# Patient Record
Sex: Female | Born: 1940
Health system: Southern US, Community
[De-identification: ages and names within clinical notes are randomized; demographics above are authoritative.]

## PROBLEM LIST (undated history)

## (undated) DIAGNOSIS — M1990 Unspecified osteoarthritis, unspecified site: Secondary | ICD-10-CM

## (undated) DIAGNOSIS — G971 Other reaction to spinal and lumbar puncture: Secondary | ICD-10-CM

## (undated) DIAGNOSIS — K219 Gastro-esophageal reflux disease without esophagitis: Secondary | ICD-10-CM

## (undated) DIAGNOSIS — I10 Essential (primary) hypertension: Secondary | ICD-10-CM

## (undated) DIAGNOSIS — Z8719 Personal history of other diseases of the digestive system: Secondary | ICD-10-CM

## (undated) HISTORY — PX: TUBAL LIGATION: SHX77

## (undated) HISTORY — PX: ABDOMINAL HYSTERECTOMY: SHX81

---
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
## 2011-05-27 ENCOUNTER — Ambulatory Visit: Payer: Self-pay | Admitting: Family Medicine

## 2012-02-11 ENCOUNTER — Ambulatory Visit: Payer: Self-pay | Admitting: Internal Medicine

## 2012-06-06 ENCOUNTER — Ambulatory Visit: Payer: Self-pay | Admitting: Family Medicine

## 2012-12-13 ENCOUNTER — Ambulatory Visit: Payer: Self-pay | Admitting: Family Medicine

## 2013-06-14 ENCOUNTER — Ambulatory Visit: Payer: Self-pay | Admitting: Family Medicine

## 2014-04-18 DIAGNOSIS — M5135 Other intervertebral disc degeneration, thoracolumbar region: Secondary | ICD-10-CM | POA: Insufficient documentation

## 2014-06-25 ENCOUNTER — Ambulatory Visit: Payer: Self-pay | Admitting: Internal Medicine

## 2015-05-22 ENCOUNTER — Other Ambulatory Visit: Payer: Self-pay | Admitting: Internal Medicine

## 2015-05-22 DIAGNOSIS — Z1231 Encounter for screening mammogram for malignant neoplasm of breast: Secondary | ICD-10-CM

## 2015-07-01 ENCOUNTER — Ambulatory Visit
Admission: RE | Admit: 2015-07-01 | Discharge: 2015-07-01 | Disposition: A | Discharge status: Home / Self Care | Payer: PPO | Source: Ambulatory Visit | Attending: Internal Medicine | Admitting: Internal Medicine

## 2015-07-01 DIAGNOSIS — Z1231 Encounter for screening mammogram for malignant neoplasm of breast: Secondary | ICD-10-CM | POA: Diagnosis not present

## 2016-05-19 ENCOUNTER — Other Ambulatory Visit: Payer: Self-pay | Admitting: Internal Medicine

## 2016-05-19 DIAGNOSIS — Z1231 Encounter for screening mammogram for malignant neoplasm of breast: Secondary | ICD-10-CM

## 2016-05-21 DIAGNOSIS — I10 Essential (primary) hypertension: Secondary | ICD-10-CM | POA: Diagnosis not present

## 2016-05-21 DIAGNOSIS — M1990 Unspecified osteoarthritis, unspecified site: Secondary | ICD-10-CM | POA: Diagnosis not present

## 2016-05-21 DIAGNOSIS — M25562 Pain in left knee: Secondary | ICD-10-CM | POA: Diagnosis not present

## 2016-05-21 DIAGNOSIS — M25561 Pain in right knee: Secondary | ICD-10-CM | POA: Diagnosis not present

## 2016-05-21 DIAGNOSIS — F411 Generalized anxiety disorder: Secondary | ICD-10-CM | POA: Diagnosis not present

## 2016-05-21 DIAGNOSIS — G8929 Other chronic pain: Secondary | ICD-10-CM | POA: Diagnosis not present

## 2016-05-21 DIAGNOSIS — Z1231 Encounter for screening mammogram for malignant neoplasm of breast: Secondary | ICD-10-CM | POA: Diagnosis not present

## 2016-05-21 DIAGNOSIS — E7800 Pure hypercholesterolemia, unspecified: Secondary | ICD-10-CM | POA: Diagnosis not present

## 2016-06-02 DIAGNOSIS — R0609 Other forms of dyspnea: Secondary | ICD-10-CM | POA: Diagnosis not present

## 2016-06-02 DIAGNOSIS — M5137 Other intervertebral disc degeneration, lumbosacral region: Secondary | ICD-10-CM | POA: Diagnosis not present

## 2016-06-02 DIAGNOSIS — E6609 Other obesity due to excess calories: Secondary | ICD-10-CM | POA: Diagnosis not present

## 2016-06-10 DIAGNOSIS — M170 Bilateral primary osteoarthritis of knee: Secondary | ICD-10-CM | POA: Diagnosis not present

## 2016-06-10 DIAGNOSIS — M25561 Pain in right knee: Secondary | ICD-10-CM | POA: Diagnosis not present

## 2016-06-10 DIAGNOSIS — M25562 Pain in left knee: Secondary | ICD-10-CM | POA: Diagnosis not present

## 2016-06-10 DIAGNOSIS — S83011A Lateral subluxation of right patella, initial encounter: Secondary | ICD-10-CM | POA: Diagnosis not present

## 2016-07-12 ENCOUNTER — Ambulatory Visit
Admission: RE | Admit: 2016-07-12 | Discharge: 2016-07-12 | Disposition: A | Discharge status: Home / Self Care | Payer: PPO | Source: Ambulatory Visit | Attending: Internal Medicine | Admitting: Internal Medicine

## 2016-07-12 DIAGNOSIS — Z1231 Encounter for screening mammogram for malignant neoplasm of breast: Secondary | ICD-10-CM | POA: Insufficient documentation

## 2016-07-27 DIAGNOSIS — Z79899 Other long term (current) drug therapy: Secondary | ICD-10-CM | POA: Diagnosis not present

## 2016-07-27 DIAGNOSIS — E7800 Pure hypercholesterolemia, unspecified: Secondary | ICD-10-CM | POA: Diagnosis not present

## 2016-07-27 DIAGNOSIS — I10 Essential (primary) hypertension: Secondary | ICD-10-CM | POA: Diagnosis not present

## 2016-07-27 DIAGNOSIS — N390 Urinary tract infection, site not specified: Secondary | ICD-10-CM | POA: Diagnosis not present

## 2016-08-03 DIAGNOSIS — Z0000 Encounter for general adult medical examination without abnormal findings: Secondary | ICD-10-CM | POA: Diagnosis not present

## 2016-08-03 DIAGNOSIS — E7800 Pure hypercholesterolemia, unspecified: Secondary | ICD-10-CM | POA: Diagnosis not present

## 2016-08-03 DIAGNOSIS — Z79899 Other long term (current) drug therapy: Secondary | ICD-10-CM | POA: Diagnosis not present

## 2016-08-03 DIAGNOSIS — I10 Essential (primary) hypertension: Secondary | ICD-10-CM | POA: Diagnosis not present

## 2016-08-03 DIAGNOSIS — M5135 Other intervertebral disc degeneration, thoracolumbar region: Secondary | ICD-10-CM | POA: Diagnosis not present

## 2016-09-28 ENCOUNTER — Ambulatory Visit: Payer: PPO | Admitting: Urology

## 2016-09-28 ENCOUNTER — Encounter: Payer: Self-pay | Admitting: Urology

## 2016-09-28 VITALS — BP 126/79 | HR 67 | Ht 63.0 in | Wt 166.7 lb

## 2016-09-28 DIAGNOSIS — R3129 Other microscopic hematuria: Secondary | ICD-10-CM

## 2016-09-28 DIAGNOSIS — M1990 Unspecified osteoarthritis, unspecified site: Secondary | ICD-10-CM | POA: Insufficient documentation

## 2016-09-28 DIAGNOSIS — R3913 Splitting of urinary stream: Secondary | ICD-10-CM

## 2016-09-28 DIAGNOSIS — Z87448 Personal history of other diseases of urinary system: Secondary | ICD-10-CM | POA: Insufficient documentation

## 2016-09-28 DIAGNOSIS — K219 Gastro-esophageal reflux disease without esophagitis: Secondary | ICD-10-CM | POA: Insufficient documentation

## 2016-09-28 DIAGNOSIS — K589 Irritable bowel syndrome without diarrhea: Secondary | ICD-10-CM | POA: Insufficient documentation

## 2016-09-28 DIAGNOSIS — E785 Hyperlipidemia, unspecified: Secondary | ICD-10-CM | POA: Insufficient documentation

## 2016-09-28 DIAGNOSIS — L409 Psoriasis, unspecified: Secondary | ICD-10-CM | POA: Insufficient documentation

## 2016-09-28 DIAGNOSIS — I10 Essential (primary) hypertension: Secondary | ICD-10-CM | POA: Insufficient documentation

## 2016-09-28 NOTE — Progress Notes (Signed)
09/28/2016 3:06 PM   Holly Robbins 02-May-1941 [id number]  Referring provider: Marguarite ArbourJeffrey D Sparks, MD [address] [facility] [place], [place] [id number]  Chief Complaint  Patient presents with  . New Patient (Initial Visit)    microscopic hematuria     HPI: Consultation for microscopic hematuria. Patient had a urinalysis done January 2018, October 2016, May 2016, August 2015 all with 4-10 red blood cells per high powered field. There were many bacteria. No gross hematuria. She quit smoking in 1990. No other exposures.   Hx in 1973.   She voids with an intermittent stream, sometimes not empty.  No frequency except in AM after coffee. She has some urgency. Noc x 2.   She developed hives on her face after a CT with contrast in the past.   PMH: No past medical history on file.  Surgical History: Past Surgical History:  Procedure Laterality Date  . ABDOMINAL HYSTERECTOMY      Home Medications:  Allergies as of 09/28/2016      Reactions   Atorvastatin    Other reaction(s): Muscle Pain   Erythromycin Hives   Other Hives   Xray dye eggs   Penicillins Hives      Medication List       Accurate as of 09/28/16  3:06 PM. Always use your most recent med list.          aspirin EC 81 MG tablet Take by mouth.   CALCIUM 600 600 MG Tabs tablet Generic drug:  calcium carbonate Take by mouth.   diclofenac 75 MG EC tablet Commonly known as:  VOLTAREN TAKE ONE TABLET BY MOUTH TWICE DAILY WITH MEALS   dicyclomine 20 MG tablet Commonly known as:  BENTYL Take by mouth.   lisinopril-hydrochlorothiazide 20-12.5 MG tablet Commonly known as:  PRINZIDE,ZESTORETIC TAKE ONE TABLET BY MOUTH ONCE DAILY   METAMUCIL 0.52 g capsule Generic drug:  psyllium Take by mouth.   ranitidine 300 MG capsule Commonly known as:  ZANTAC Take by mouth.   Vitamin B 12 100 MCG Lozg Take by mouth.   vitamin E 400 UNIT capsule Take by mouth.       Allergies:    Allergies  Allergen Reactions  . Atorvastatin     Other reaction(s): Muscle Pain  . Erythromycin Hives  . Other Hives    Xray dye eggs  . Penicillins Hives    Family History: Family History  Problem Relation Age of Onset  . Chronic Renal Failure Father   . Prostate cancer Father   . Hematuria Sister   . Chronic Renal Failure Son   . Breast cancer Neg Hx     Social History:  reports that she quit smoking about 26 years ago. She has never used smokeless tobacco. She reports that she does not drink alcohol or use drugs.  ROS: UROLOGY Frequent Urination?: No Hard to postpone urination?: Yes Burning/pain with urination?: No Get up at night to urinate?: Yes Leakage of urine?: No Urine stream starts and stops?: No Trouble starting stream?: No Do you have to strain to urinate?: No Blood in urine?: Yes Urinary tract infection?: No Sexually transmitted disease?: No Injury to kidneys or bladder?: No Painful intercourse?: No Weak stream?: No Currently pregnant?: No Vaginal bleeding?: No Last menstrual period?: n  Gastrointestinal Nausea?: No Vomiting?: No Indigestion/heartburn?: Yes Diarrhea?: No Constipation?: No  Constitutional Fever: Yes Night sweats?: No Weight loss?: No Fatigue?: Yes  Skin Skin rash/lesions?: No Itching?: No  Eyes  Blurred vision?: No Double vision?: No  Ears/Nose/Throat Sore throat?: No Sinus problems?: No  Hematologic/Lymphatic Swollen glands?: No Easy bruising?: No  Cardiovascular Leg swelling?: No Chest pain?: No  Respiratory Cough?: No Shortness of breath?: No  Endocrine Excessive thirst?: No  Musculoskeletal Back pain?: Yes Joint pain?: Yes  Neurological Headaches?: No Dizziness?: No  Psychologic Depression?: No Anxiety?: No  Physical Exam: BP 126/79   Pulse 67   Ht 5\' 3"  (1.6 m)   Wt 75.6 kg (166 lb 11.2 oz)   BMI 29.53 kg/m   Constitutional:  Alert and oriented, No acute distress. HEENT:  AT,  moist mucus membranes.  Trachea midline, no masses. Cardiovascular: No clubbing, cyanosis, or edema. Respiratory: Normal respiratory effort, no increased work of breathing. GI: Abdomen is soft, nontender, nondistended, no abdominal masses GU: No CVA tenderness. Skin: No rashes, bruises or suspicious lesions. Lymph: No cervical or inguinal adenopathy. Neurologic: Grossly intact, no focal deficits, moving all 4 extremities. Psychiatric: Normal mood and affect.  Laboratory Data: No results found for: WBC, HGB, HCT, MCV, PLT  No results found for: CREATININE  No results found for: PSA  No results found for: TESTOSTERONE  No results found for: HGBA1C  Urinalysis No results found for: COLORURINE, APPEARANCEUR, LABSPEC, PHURINE, GLUCOSEU, HGBUR, BILIRUBINUR, KETONESUR, PROTEINUR, UROBILINOGEN, NITRITE, LEUKOCYTESUR   Assessment & Plan:    1. Microhematuria - discussed with patient nature r/b of CT and cystoscopy. She elects to proceed. Given her prior contrast rxn, will scan with non-con first. Urine for Cx.   2. Intermittent stream - check PVR    - Urinalysis, Complete   No Follow-up on file.  Jerilee Field, MD  [facility] [address] [place], [place] [phone]

## 2016-09-29 LAB — URINALYSIS, COMPLETE
BILIRUBIN UA: NEGATIVE
Glucose, UA: NEGATIVE
KETONES UA: NEGATIVE
LEUKOCYTES UA: NEGATIVE
Nitrite, UA: NEGATIVE
PH UA: 5 (ref 5.0–7.5)
PROTEIN UA: NEGATIVE
Specific Gravity, UA: <1.005 — ABNORMAL LOW (ref 1.005–1.030)
UUROB: 0.2 mg/dL (ref 0.2–1.0)

## 2016-09-29 LAB — MICROSCOPIC EXAMINATION: WBC UA: NONE SEEN /HPF (ref 0–?)

## 2016-10-02 LAB — CULTURE, URINE COMPREHENSIVE

## 2016-10-08 ENCOUNTER — Ambulatory Visit
Admission: RE | Admit: 2016-10-08 | Discharge: 2016-10-08 | Disposition: A | Discharge status: Home / Self Care | Payer: PPO | Source: Ambulatory Visit | Attending: Urology | Admitting: Urology

## 2016-10-08 DIAGNOSIS — R3129 Other microscopic hematuria: Secondary | ICD-10-CM | POA: Insufficient documentation

## 2016-10-08 DIAGNOSIS — I700 Atherosclerosis of aorta: Secondary | ICD-10-CM | POA: Diagnosis not present

## 2016-10-08 DIAGNOSIS — N289 Disorder of kidney and ureter, unspecified: Secondary | ICD-10-CM | POA: Diagnosis not present

## 2016-10-13 ENCOUNTER — Telehealth: Payer: Self-pay

## 2016-10-13 ENCOUNTER — Ambulatory Visit: Payer: PPO | Admitting: Urology

## 2016-10-13 VITALS — BP 117/68 | HR 71 | Ht 63.0 in | Wt 167.7 lb

## 2016-10-13 DIAGNOSIS — R3129 Other microscopic hematuria: Secondary | ICD-10-CM

## 2016-10-13 LAB — URINALYSIS, COMPLETE
Bilirubin, UA: NEGATIVE
GLUCOSE, UA: NEGATIVE
Ketones, UA: NEGATIVE
Leukocytes, UA: NEGATIVE
Nitrite, UA: NEGATIVE
PH UA: 5.5 (ref 5.0–7.5)
Protein, UA: NEGATIVE
Specific Gravity, UA: 1.01 (ref 1.005–1.030)
UUROB: 0.2 mg/dL (ref 0.2–1.0)

## 2016-10-13 LAB — MICROSCOPIC EXAMINATION: Bacteria, UA: NONE SEEN

## 2016-10-13 MED ORDER — LIDOCAINE HCL 2 % EX GEL
1.0000 "application " | Freq: Once | CUTANEOUS | Status: AC
  Administered 2016-10-13: 1 via URETHRAL

## 2016-10-13 MED ORDER — CIPROFLOXACIN HCL 500 MG PO TABS
500.0000 mg | ORAL_TABLET | Freq: Once | ORAL | Status: AC
  Administered 2016-10-13: 500 mg via ORAL

## 2016-10-13 NOTE — Telephone Encounter (Signed)
Jerilee FieldMatthew Eskridge, MD  Skeet Latchhelsea C Watkins, LPN        Notify patient her CT scan looked OK - no worrisome findings. F/u as planned for exam and cystoscopy.    LMOM- CT ok, keep f/u as planned for cysto and exam

## 2016-10-13 NOTE — Progress Notes (Signed)
   10/13/16  CC:  Chief Complaint  Patient presents with  . Cysto    microscopic hemturia    HPI: The patient is a 76 year old female who presents today for completion of her microscopic hematuria workup. Patient had a urinalysis done January 2018, October 2016, May 2016, August 2015 all with 4-10 red blood cells per high powered field. There were many bacteria. No gross hematuria. She quit smoking in 1990. No other exposures. Most recent urine culture at initial visit was unremarkable for pathogens.  The patient has a history of hives from IV contrast, so she only underwent a CT stone protocol which was unremarkable for source of hematuria. There was no hydronephrosis, stones, or suspicious areas.  There was a small amount of air in the bladder though she had a bladder infection not long before the study was performed. There is no bowel thickening or inflammation around the urinary bladder to suggest a possible fistula.  NED. A&Ox3.   No respiratory distress   Abd soft, NT, ND Normal external genitalia with patent urethral meatus  Cystoscopy Procedure Note  Patient identification was confirmed, informed consent was obtained, and patient was prepped using Betadine solution.  Lidocaine jelly was administered per urethral meatus.    Preoperative abx where received prior to procedure.    Procedure: - Flexible cystoscope introduced, without any difficulty.   - Thorough search of the bladder revealed:    normal urethral meatus    normal urothelium    no stones    no ulcers     no tumors    no urethral polyps    no trabeculation  - Ureteral orifices were normal in position and appearance.  Post-Procedure: - Patient tolerated the procedure well  Assessment/ Plan:  1. Microscopic hematuria -Negative work up.  Since she has had recurrent microhematuria for at least 4 years and this work up was negative, I do not think she needs further follow up at this time unless she were to  develop gross hematuria.  2. History of UTI -patient will contact office if she feels like she is developing a UTI.   Hildred LaserBrian James Symphonie Schneiderman, MD

## 2016-11-03 DIAGNOSIS — I10 Essential (primary) hypertension: Secondary | ICD-10-CM | POA: Diagnosis not present

## 2016-11-03 DIAGNOSIS — Z79899 Other long term (current) drug therapy: Secondary | ICD-10-CM | POA: Diagnosis not present

## 2016-11-03 DIAGNOSIS — E7800 Pure hypercholesterolemia, unspecified: Secondary | ICD-10-CM | POA: Diagnosis not present

## 2016-11-10 DIAGNOSIS — E7800 Pure hypercholesterolemia, unspecified: Secondary | ICD-10-CM | POA: Diagnosis not present

## 2016-11-10 DIAGNOSIS — I10 Essential (primary) hypertension: Secondary | ICD-10-CM | POA: Diagnosis not present

## 2016-11-25 DIAGNOSIS — M1711 Unilateral primary osteoarthritis, right knee: Secondary | ICD-10-CM | POA: Diagnosis not present

## 2016-11-26 ENCOUNTER — Other Ambulatory Visit: Payer: Self-pay | Admitting: Orthopedic Surgery

## 2016-11-26 DIAGNOSIS — M1711 Unilateral primary osteoarthritis, right knee: Secondary | ICD-10-CM

## 2016-12-14 ENCOUNTER — Ambulatory Visit
Admission: RE | Admit: 2016-12-14 | Discharge: 2016-12-14 | Disposition: A | Discharge status: Home / Self Care | Payer: PPO | Source: Ambulatory Visit | Attending: Orthopedic Surgery | Admitting: Orthopedic Surgery

## 2016-12-14 DIAGNOSIS — M1711 Unilateral primary osteoarthritis, right knee: Secondary | ICD-10-CM | POA: Insufficient documentation

## 2016-12-14 DIAGNOSIS — M1611 Unilateral primary osteoarthritis, right hip: Secondary | ICD-10-CM | POA: Diagnosis not present

## 2017-01-20 DIAGNOSIS — R1084 Generalized abdominal pain: Secondary | ICD-10-CM | POA: Diagnosis not present

## 2017-01-28 DIAGNOSIS — M1711 Unilateral primary osteoarthritis, right knee: Secondary | ICD-10-CM | POA: Diagnosis not present

## 2017-02-02 ENCOUNTER — Encounter
Admission: RE | Admit: 2017-02-02 | Discharge: 2017-02-02 | Disposition: A | Discharge status: Home / Self Care | Payer: PPO | Source: Ambulatory Visit | Attending: Orthopedic Surgery | Admitting: Orthopedic Surgery

## 2017-02-02 DIAGNOSIS — Z01818 Encounter for other preprocedural examination: Secondary | ICD-10-CM | POA: Diagnosis not present

## 2017-02-02 DIAGNOSIS — I10 Essential (primary) hypertension: Secondary | ICD-10-CM | POA: Diagnosis not present

## 2017-02-02 HISTORY — DX: Unspecified osteoarthritis, unspecified site: M19.90

## 2017-02-02 HISTORY — DX: Essential (primary) hypertension: I10

## 2017-02-02 HISTORY — DX: Personal history of other diseases of the digestive system: Z87.19

## 2017-02-02 HISTORY — DX: Gastro-esophageal reflux disease without esophagitis: K21.9

## 2017-02-02 HISTORY — DX: Other reaction to spinal and lumbar puncture: G97.1

## 2017-02-02 LAB — TYPE AND SCREEN
ABO/RH(D): O POS
Antibody Screen: NEGATIVE

## 2017-02-02 LAB — URINALYSIS, ROUTINE W REFLEX MICROSCOPIC
BACTERIA UA: NONE SEEN
Bilirubin Urine: NEGATIVE
Glucose, UA: NEGATIVE mg/dL
KETONES UR: NEGATIVE mg/dL
Leukocytes, UA: NEGATIVE
Nitrite: NEGATIVE
PROTEIN: NEGATIVE mg/dL
Specific Gravity, Urine: 1.008 (ref 1.005–1.030)
WBC UA: NONE SEEN WBC/hpf (ref 0–5)
pH: 6 (ref 5.0–8.0)

## 2017-02-02 LAB — SURGICAL PCR SCREEN
MRSA, PCR: NEGATIVE
STAPHYLOCOCCUS AUREUS: NEGATIVE

## 2017-02-02 LAB — SEDIMENTATION RATE: Sed Rate: 15 mm/hr (ref 0–30)

## 2017-02-02 LAB — PROTIME-INR
INR: 1.06
PROTHROMBIN TIME: 13.8 s (ref 11.4–15.2)

## 2017-02-02 LAB — APTT: aPTT: 32 seconds (ref 24–36)

## 2017-02-02 NOTE — Pre-Procedure Instructions (Signed)
Progress Notes - in this encounter  Table of Contents for Progress Notes Marlena Clipper, MD - 01/28/2017 1:45 PM EDT Margaretmary Eddy, CMA - 01/28/2017 1:45 PM EDT   Marlena Clipper, MD - 01/28/2017 1:45 PM EDT Formatting of this note may be different from the original. Chief Complaint  Patient presents with  . Knee Pain  H&P R TKA scheduled 02/08/17.   History of Present Illness: Holly Robbins is a 76 y.o. female who comes to clinic today for history and physical for upcoming right total knee replacement scheduled for 02/08/2017. The patient has prior x-rays that show severe osteoarthritis of the right knee with significant bone loss of the patella. She has had pain for years. The patient has tried corticosteroid injections without significant relief. She has been in an exercise program. She has also worn a brace without relief. The patient is currently taking diclofenac without significant pain relief. She is also on Zantac. She is unable to walk for any significant distance due to pain. The patient had her MyKnee CT completed and comes in today to discuss surgery.   She is not on blood thinners. She does take a daily aspirin.  Past Medical History: Past Medical History:  Diagnosis Date  . Arthritis  . GERD (gastroesophageal reflux disease)  . History of hematuria  . Hyperlipidemia, unspecified  . Hypertension  . IBS (irritable bowel syndrome)  . Psoriasis, unspecified   Past Surgical History: Past Surgical History:  Procedure Laterality Date  . APPENDECTOMY  . HYSTERECTOMY  . TUBAL LIGATION   Past Family History: Family History  Problem Relation Age of Onset  . High blood pressure (Hypertension) Mother  . Diabetes type II Mother  . Stroke Mother  . High blood pressure (Hypertension) Father  . Coronary Artery Disease (Blocked arteries around heart) Father  . Kidney failure Father  . High blood pressure (Hypertension) Sister  . Lung cancer Sister  . Lung  cancer Sister  . Stomach cancer Sister   Medications: Current Outpatient Prescriptions Ordered in Epic  Medication Sig Dispense Refill  . aspirin 81 MG EC tablet Take 81 mg by mouth once daily.  Marland Kitchen BIOTIN ORAL Take 1 tablet by mouth once daily.  . calcium carbonate (OS-CAL) 600 mg (1,500 mg) Tab Take 600 mg by mouth 2 (two) times daily with meals.  . ciprofloxacin HCl (CIPRO) 500 MG tablet Take 1 tablet (500 mg total) by mouth 2 (two) times daily for 10 days. 20 tablet 0  . CRANBERRY FRUIT CONCENTRATE ORAL Take 140 mg by mouth daily.  . diclofenac (VOLTAREN) 75 MG EC tablet TAKE ONE TABLET BY MOUTH TWICE DAILY WITH MEALS 180 tablet 2  . dicyclomine (BENTYL) 20 mg tablet TAKE ONE TABLET BY MOUTH TWICE DAILY 180 tablet 2  . ERGOCALCIFEROL, VITAMIN D2, (VITAMIN D ORAL) Take 400 mg by mouth daily.  Marland Kitchen GLUC/CHON-MSM#1/C/MANG/BOS/BOR (OSTEO BI-FLEX ORAL) Take 2 tablets by mouth daily.  Marland Kitchen LACTOBAC CMB #3/FOS/PANTETHINE (PROBIOTIC & ACIDOPHILUS ORAL) Take by mouth.  Marland Kitchen lisinopril-hydrochlorothiazide (PRINZIDE,ZESTORETIC) 20-12.5 mg tablet Take 1 tablet by mouth once daily. 90 tablet 3  . lysine (L-LYSINE) 500 mg Cap Take 1 tablet by mouth daily.  . metroNIDAZOLE (FLAGYL) 500 MG tablet Take 1 tablet (500 mg total) by mouth 3 (three) times daily for 10 days. 30 tablet 0  . multivitamin-minerals-lutein (SENIOR VITAMIN) Tab Take 1 tablet by mouth daily.  Marland Kitchen omega 3-dha-epa-fish oil 1,200 (144-216) mg Cap Take 1 tablet by mouth daily.  . pravastatin (PRAVACHOL)  20 MG tablet Take 1 tablet (20 mg total) by mouth nightly. 90 tablet 3  . psyllium (METAMUCIL) 3.4 gram packet Take 1 packet by mouth nightly.  . ranitidine (ZANTAC) 300 MG tablet Take 1 tablet (300 mg total) by mouth once daily. 90 tablet 3  . tocopheryl (VITAMIN E) 400 UNIT capsule Take 400 Units by mouth 2 (two) times daily.  . vitamins B1 B6 B12 Tab Take 1 tablet by mouth daily.  Marland Kitchen. dicyclomine (BENTYL) 20 mg tablet Take 1 tablet (20 mg total) by  mouth 2 (two) times daily. 180 tablet 3  . ranitidine (ZANTAC) 300 MG capsule Take 1 capsule (300 mg total) by mouth nightly. 90 capsule PRN  . traZODone (DESYREL) 50 MG tablet Take 1 tablet (50 mg total) by mouth nightly as needed for Sleep. 90 tablet PRN   No current Epic-ordered facility-administered medications on file.   Allergies: Allergies  Allergen Reactions  . Erythromycin Hives  . Lipitor [Atorvastatin] Muscle Pain  . Other Hives  Xray dye eggs  . Penicillins Hives    Body mass index is 31.09 kg/m.  Review of Systems:  A comprehensive 14 point ROS was performed, reviewed, and the pertinent orthopaedic findings are documented in the HPI.  Physical Exam: General/Constitutional: No apparent distress: well-nourished and well developed. Eyes: Pupils equal, round with synchronous movement. Lungs: Clear to auscultation HEENT: Normal Vascular: No edema, swelling or tenderness, except as noted in detailed exam. Cardiac: Heart rate and rhythm is regular. Integumentary: No impressive skin lesions present, except as noted in detailed exam. Neuro/Psych: Normal mood and affect, oriented to person, place and time.  Vitals:  01/28/17 1349  BP: 110/70   Orthopaedic Examination: Right Left   Gait Normal  Alignment Valgus deformity that is passively correctable.  Inspection Normal Normal  Palpation Knee Normal Normal  Range of Motion Knee 8-115 degrees Normal  Strength Normal Normal  Meniscus Exam Normal Normal  Ligament Exam Normal Normal  Patella Exam Normal Normal  Reflexes Normal Normal  Neurologic Normal Normal   She has good motion of her right hip with no pain.   Lungs are clear. Heart rate and rhythm is normal. HEENT is marked for removable partial upper plate.  Imaging Studies: The patient's right knee CT was reviewed with her today. She has significant osteoarthritis.  Assessment:  ICD-10-CM ICD-9-CM  1. Primary osteoarthritis of right knee M17.11  715.16    Plan: The findings of today's exam were discussed with the patient and family which include:  The patient has clinical findings of severe valgus of right knee with osteoarthritis.   She is scheduled for right total knee replacement in 2 weeks. We discussed the procedure at length today using a knee model. The postoperative course was discussed with her as well. All questions were answered.   Surgical Risks: The nature of the condition and the proposed procedure has been reviewed in detail with the patient. Surgical versus non-surgical options and prognosis for recovery have been reviewed and the inherent risks and benefits of each have been discussed including the risks of infection, bleeding, injury to nerves / blood vessels / tendons, incomplete relief of symptoms, persisting pain and / or stiffness, loss of function, complex regional pain syndrome, failure of procedure, as appropriate.  The patient will call with any questions or concerns that may arise.  Scribe Attestation: I, Georgia Lopesharlene Boswell, am acting as scribe for [employer] CorporationMICHAEL JOSEPH MENZ, MD.

## 2017-02-02 NOTE — Patient Instructions (Addendum)
  Your procedure is scheduled ZH:YQMVHQIon:Tuesday July 17 , 2018. Report to Same Day Surgery. To find out your arrival time please call [phone] between 1PM - 3PM on Monday February 07, 2017.  Remember: Instructions that are not followed completely may result in serious medical risk, up to and including death, or upon the discretion of your surgeon and anesthesiologist your surgery may need to be rescheduled.    _x___ 1. Do not eat food or drink liquids after midnight. No gum chewing or hard candies.     ____ 2. No Alcohol for 24 hours before or after surgery.   ____ 3. Bring all medications with you on the day of surgery if instructed.    __x__ 4. Notify your doctor if there is any change in your medical condition     (cold, fever, infections).    _____ 5. No smoking 24 hours prior to surgery.     Do not wear jewelry, make-up, hairpins, clips or nail polish.  Do not wear lotions, powders, or perfumes.   Do not shave 48 hours prior to surgery. Men may shave face and neck.  Do not bring valuables to the hospital.    [facility] is not [facility] any belongings or valuables.               Contacts, dentures or bridgework may not be worn into surgery.  Leave your suitcase in the car. After surgery it may be brought to your room.  For patients admitted to the hospital, discharge time is determined by your treatment team.   Patients discharged the day of surgery will not be allowed to drive home.    Please read over the following fact sheets that you were given:   [facility]  _x___ Take these medicines the morning of surgery with A SIP OF WATER:    1. ranitidine (ZANTAC) plus an additional dose at bedtime the night prior to surgery.   ____ Fleet Enema (as directed)   _x___ Use CHG Soap as directed on instruction sheet  ____ Use inhalers on the day of surgery and bring to hospital day of surgery  ____ Stop metformin 2 days prior to surgery    ____ Take 1/2  of usual insulin dose the night before surgery and none on the morning of surgery.   __x__ Stop aspirin 7 days prior to surgery.  ____ Stop Anti-inflammatories such as Advil, Aleve, diclofenac, Ibuprofen, Motrin, Naproxen, Naprosyn, Goodies powders or  aspirin products. OK to take Tylenol.    __x__ Stop supplements:BIOTIN , CRANBERRY CONCENTRATE, Omega-3 Fatty Acids (FISH OIL), vitamin  E,L-Lysine, Glucosamine-Chondroit-Vit C-Mn (GLUCOSAMINE CHONDR)    until after surgery.    ____ Bring C-Pap to the hospital.

## 2017-02-03 LAB — URINE CULTURE

## 2017-02-07 MED ORDER — TRANEXAMIC ACID 1000 MG/10ML IV SOLN
1000.0000 mg | INTRAVENOUS | Status: AC
  Administered 2017-02-08: 1000 mg via INTRAVENOUS
  Filled 2017-02-07: qty 10

## 2017-02-07 MED ORDER — CLINDAMYCIN PHOSPHATE 900 MG/50ML IV SOLN
900.0000 mg | Freq: Once | INTRAVENOUS | Status: AC
  Administered 2017-02-08: 900 mg via INTRAVENOUS

## 2017-02-08 ENCOUNTER — Inpatient Hospital Stay
Admission: RE | Admit: 2017-02-08 | Discharge: 2017-02-11 | DRG: 470 | Disposition: A | Discharge status: Skilled Nursing Facility | Payer: PPO | Source: Ambulatory Visit | Attending: Orthopedic Surgery | Admitting: Orthopedic Surgery

## 2017-02-08 ENCOUNTER — Inpatient Hospital Stay: Payer: PPO

## 2017-02-08 ENCOUNTER — Inpatient Hospital Stay: Payer: PPO | Admitting: Anesthesiology

## 2017-02-08 ENCOUNTER — Encounter: Payer: Self-pay | Admitting: *Deleted

## 2017-02-08 ENCOUNTER — Encounter
Admission: RE | Disposition: A | Discharge status: Skilled Nursing Facility | Payer: Self-pay | Source: Ambulatory Visit | Attending: Orthopedic Surgery

## 2017-02-08 DIAGNOSIS — R262 Difficulty in walking, not elsewhere classified: Secondary | ICD-10-CM | POA: Diagnosis not present

## 2017-02-08 DIAGNOSIS — Z91041 Radiographic dye allergy status: Secondary | ICD-10-CM

## 2017-02-08 DIAGNOSIS — Z471 Aftercare following joint replacement surgery: Secondary | ICD-10-CM | POA: Diagnosis not present

## 2017-02-08 DIAGNOSIS — Z880 Allergy status to penicillin: Secondary | ICD-10-CM | POA: Diagnosis not present

## 2017-02-08 DIAGNOSIS — M1711 Unilateral primary osteoarthritis, right knee: Principal | ICD-10-CM | POA: Diagnosis present

## 2017-02-08 DIAGNOSIS — Z888 Allergy status to other drugs, medicaments and biological substances status: Secondary | ICD-10-CM | POA: Diagnosis not present

## 2017-02-08 DIAGNOSIS — K219 Gastro-esophageal reflux disease without esophagitis: Secondary | ICD-10-CM | POA: Diagnosis present

## 2017-02-08 DIAGNOSIS — I10 Essential (primary) hypertension: Secondary | ICD-10-CM | POA: Diagnosis not present

## 2017-02-08 DIAGNOSIS — Z91040 Latex allergy status: Secondary | ICD-10-CM

## 2017-02-08 DIAGNOSIS — Z87891 Personal history of nicotine dependence: Secondary | ICD-10-CM

## 2017-02-08 DIAGNOSIS — Z96651 Presence of right artificial knee joint: Secondary | ICD-10-CM | POA: Diagnosis not present

## 2017-02-08 DIAGNOSIS — E785 Hyperlipidemia, unspecified: Secondary | ICD-10-CM | POA: Diagnosis not present

## 2017-02-08 DIAGNOSIS — M25569 Pain in unspecified knee: Secondary | ICD-10-CM | POA: Diagnosis not present

## 2017-02-08 DIAGNOSIS — G8918 Other acute postprocedural pain: Secondary | ICD-10-CM

## 2017-02-08 DIAGNOSIS — R339 Retention of urine, unspecified: Secondary | ICD-10-CM | POA: Diagnosis not present

## 2017-02-08 DIAGNOSIS — M25561 Pain in right knee: Secondary | ICD-10-CM | POA: Diagnosis present

## 2017-02-08 DIAGNOSIS — M6281 Muscle weakness (generalized): Secondary | ICD-10-CM | POA: Diagnosis not present

## 2017-02-08 DIAGNOSIS — Z5189 Encounter for other specified aftercare: Secondary | ICD-10-CM | POA: Diagnosis not present

## 2017-02-08 DIAGNOSIS — F419 Anxiety disorder, unspecified: Secondary | ICD-10-CM | POA: Diagnosis not present

## 2017-02-08 DIAGNOSIS — Z7401 Bed confinement status: Secondary | ICD-10-CM | POA: Diagnosis not present

## 2017-02-08 HISTORY — PX: TOTAL KNEE ARTHROPLASTY: SHX125

## 2017-02-08 LAB — CBC
HCT: 37.7 % (ref 35.0–47.0)
HEMOGLOBIN: 13.2 g/dL (ref 12.0–16.0)
MCH: 31.9 pg (ref 26.0–34.0)
MCHC: 35 g/dL (ref 32.0–36.0)
MCV: 91.2 fL (ref 80.0–100.0)
Platelets: 314 10*3/uL (ref 150–440)
RBC: 4.13 MIL/uL (ref 3.80–5.20)
RDW: 14 % (ref 11.5–14.5)
WBC: 10.2 10*3/uL (ref 3.6–11.0)

## 2017-02-08 LAB — CREATININE, SERUM
CREATININE: 0.71 mg/dL (ref 0.44–1.00)
GFR calc Af Amer: >60 mL/min (ref >60)
GFR calc non Af Amer: >60 mL/min (ref >60)

## 2017-02-08 LAB — ABO/RH: ABO/RH(D): O POS

## 2017-02-08 SURGERY — ARTHROPLASTY, KNEE, TOTAL
Anesthesia: Spinal | Laterality: Right | Wound class: Clean

## 2017-02-08 MED ORDER — PRAVASTATIN SODIUM 20 MG PO TABS
20.0000 mg | ORAL_TABLET | Freq: Every day | ORAL | Status: DC
  Administered 2017-02-08 – 2017-02-11 (×4): 20 mg via ORAL
  Filled 2017-02-08 (×3): qty 1

## 2017-02-08 MED ORDER — ONDANSETRON HCL 4 MG/2ML IJ SOLN: 4.0000 mg | Freq: Four times a day (QID) | INTRAMUSCULAR | Status: DC | PRN

## 2017-02-08 MED ORDER — ZOLPIDEM TARTRATE 5 MG PO TABS
5.0000 mg | ORAL_TABLET | Freq: Every evening | ORAL | Status: DC | PRN
  Administered 2017-02-10: 5 mg via ORAL
  Filled 2017-02-08: qty 1

## 2017-02-08 MED ORDER — BUPIVACAINE LIPOSOME 1.3 % IJ SUSP
INTRAMUSCULAR | Status: AC
  Filled 2017-02-08: qty 20

## 2017-02-08 MED ORDER — MORPHINE SULFATE (PF) 2 MG/ML IV SOLN: 2.0000 mg | INTRAVENOUS | Status: DC | PRN

## 2017-02-08 MED ORDER — DICYCLOMINE HCL 20 MG PO TABS
20.0000 mg | ORAL_TABLET | Freq: Every day | ORAL | Status: DC | PRN
  Filled 2017-02-08: qty 1

## 2017-02-08 MED ORDER — BIOTIN 5 MG PO CAPS: ORAL_CAPSULE | Freq: Every day | ORAL | Status: DC

## 2017-02-08 MED ORDER — ONDANSETRON HCL 4 MG PO TABS: 4.0000 mg | ORAL_TABLET | Freq: Four times a day (QID) | ORAL | Status: DC | PRN

## 2017-02-08 MED ORDER — METHOCARBAMOL 500 MG PO TABS
500.0000 mg | ORAL_TABLET | Freq: Four times a day (QID) | ORAL | Status: DC | PRN
  Administered 2017-02-09 – 2017-02-10 (×4): 500 mg via ORAL
  Filled 2017-02-08 (×5): qty 1

## 2017-02-08 MED ORDER — SODIUM CHLORIDE 0.9 % IJ SOLN
INTRAMUSCULAR | Status: AC
  Filled 2017-02-08: qty 100

## 2017-02-08 MED ORDER — VITAMIN B-12 1000 MCG PO TABS
1500.0000 ug | ORAL_TABLET | Freq: Every day | ORAL | Status: DC
  Administered 2017-02-09 – 2017-02-11 (×3): 1500 ug via ORAL
  Filled 2017-02-08 (×3): qty 2

## 2017-02-08 MED ORDER — PROPOFOL 500 MG/50ML IV EMUL
INTRAVENOUS | Status: AC
  Filled 2017-02-08: qty 50

## 2017-02-08 MED ORDER — SODIUM CHLORIDE 0.9 % IV SOLN
INTRAVENOUS | Status: DC | PRN
  Administered 2017-02-08: 60 mL

## 2017-02-08 MED ORDER — PROPOFOL 10 MG/ML IV BOLUS
INTRAVENOUS | Status: DC | PRN
  Administered 2017-02-08: 10 mg via INTRAVENOUS
  Administered 2017-02-08: 15 mg via INTRAVENOUS
  Administered 2017-02-08: 10 mg via INTRAVENOUS

## 2017-02-08 MED ORDER — VITAMIN B-6 100 MG PO TABS: 100.0000 mg | ORAL_TABLET | Freq: Every day | ORAL | Status: DC

## 2017-02-08 MED ORDER — METOCLOPRAMIDE HCL 5 MG/ML IJ SOLN: 5.0000 mg | Freq: Three times a day (TID) | INTRAMUSCULAR | Status: DC | PRN

## 2017-02-08 MED ORDER — SODIUM CHLORIDE 0.9 % IV SOLN
INTRAVENOUS | Status: DC | PRN
  Administered 2017-02-08: 15 ug/min via INTRAVENOUS

## 2017-02-08 MED ORDER — MIDAZOLAM HCL 5 MG/5ML IJ SOLN
INTRAMUSCULAR | Status: DC | PRN
  Administered 2017-02-08 (×2): 0.5 mg via INTRAVENOUS
  Administered 2017-02-08 (×2): .5 mg via INTRAVENOUS

## 2017-02-08 MED ORDER — DICYCLOMINE HCL 20 MG PO TABS: 20.0000 mg | ORAL_TABLET | ORAL | Status: DC

## 2017-02-08 MED ORDER — VASOPRESSIN 20 UNIT/ML IV SOLN
INTRAVENOUS | Status: DC | PRN
  Administered 2017-02-08: 2 [IU] via INTRAVENOUS

## 2017-02-08 MED ORDER — MAGNESIUM 200 MG PO TABS
1.0000 | ORAL_TABLET | Freq: Every day | ORAL | Status: DC
  Filled 2017-02-08: qty 1

## 2017-02-08 MED ORDER — SEVOFLURANE IN SOLN
RESPIRATORY_TRACT | Status: AC
  Filled 2017-02-08: qty 250

## 2017-02-08 MED ORDER — DIPHENHYDRAMINE HCL 12.5 MG/5ML PO ELIX
12.5000 mg | ORAL_SOLUTION | ORAL | Status: DC | PRN
  Administered 2017-02-08: 25 mg via ORAL
  Filled 2017-02-08: qty 5
  Filled 2017-02-08: qty 10

## 2017-02-08 MED ORDER — FENTANYL CITRATE (PF) 100 MCG/2ML IJ SOLN
INTRAMUSCULAR | Status: AC
  Filled 2017-02-08: qty 2

## 2017-02-08 MED ORDER — BUPIVACAINE-EPINEPHRINE (PF) 0.25% -1:200000 IJ SOLN
INTRAMUSCULAR | Status: AC
  Filled 2017-02-08: qty 30

## 2017-02-08 MED ORDER — L-LYSINE 500 MG PO CAPS: 1.0000 | ORAL_CAPSULE | ORAL | Status: DC

## 2017-02-08 MED ORDER — BUPIVACAINE-EPINEPHRINE (PF) 0.25% -1:200000 IJ SOLN
INTRAMUSCULAR | Status: DC | PRN
  Administered 2017-02-08: 30 mL via PERINEURAL

## 2017-02-08 MED ORDER — HYDROCHLOROTHIAZIDE 12.5 MG PO CAPS
12.5000 mg | ORAL_CAPSULE | Freq: Every day | ORAL | Status: DC
  Administered 2017-02-09 – 2017-02-11 (×3): 12.5 mg via ORAL
  Filled 2017-02-08 (×3): qty 1

## 2017-02-08 MED ORDER — LACTATED RINGERS IV SOLN
500.0000 mL | INTRAVENOUS | Status: DC
  Administered 2017-02-08: 08:00:00 via INTRAVENOUS
  Administered 2017-02-08: 500 mL via INTRAVENOUS

## 2017-02-08 MED ORDER — ADULT MULTIVITAMIN W/MINERALS CH
1.0000 | ORAL_TABLET | Freq: Every day | ORAL | Status: DC
  Administered 2017-02-09 – 2017-02-11 (×3): 1 via ORAL
  Filled 2017-02-08 (×3): qty 1

## 2017-02-08 MED ORDER — MENTHOL 3 MG MT LOZG
1.0000 | LOZENGE | OROMUCOSAL | Status: DC | PRN
  Filled 2017-02-08: qty 9

## 2017-02-08 MED ORDER — CALCIUM CARBONATE 1250 (500 CA) MG PO TABS
500.0000 mg | ORAL_TABLET | Freq: Two times a day (BID) | ORAL | Status: DC
  Filled 2017-02-08 (×2): qty 1

## 2017-02-08 MED ORDER — SODIUM CHLORIDE 0.9 % IV SOLN
INTRAVENOUS | Status: DC
  Administered 2017-02-08 – 2017-02-09 (×2): via INTRAVENOUS

## 2017-02-08 MED ORDER — ACETAMINOPHEN 650 MG RE SUPP: 650.0000 mg | Freq: Four times a day (QID) | RECTAL | Status: DC | PRN

## 2017-02-08 MED ORDER — VASOPRESSIN 20 UNIT/ML IV SOLN
INTRAVENOUS | Status: AC
  Filled 2017-02-08: qty 1

## 2017-02-08 MED ORDER — ALUM & MAG HYDROXIDE-SIMETH 200-200-20 MG/5ML PO SUSP
30.0000 mL | ORAL | Status: DC | PRN
  Administered 2017-02-08 (×2): 30 mL via ORAL
  Filled 2017-02-08 (×2): qty 30

## 2017-02-08 MED ORDER — OXYCODONE HCL 5 MG PO TABS
5.0000 mg | ORAL_TABLET | ORAL | Status: DC | PRN
  Administered 2017-02-08: 5 mg via ORAL
  Administered 2017-02-08: 10 mg via ORAL
  Administered 2017-02-08 – 2017-02-09 (×3): 5 mg via ORAL
  Administered 2017-02-09 (×3): 10 mg via ORAL
  Administered 2017-02-10 – 2017-02-11 (×5): 5 mg via ORAL
  Administered 2017-02-11 (×2): 10 mg via ORAL
  Filled 2017-02-08: qty 2
  Filled 2017-02-08 (×4): qty 1
  Filled 2017-02-08 (×3): qty 2
  Filled 2017-02-08 (×2): qty 1
  Filled 2017-02-08: qty 2
  Filled 2017-02-08 (×4): qty 1
  Filled 2017-02-08: qty 2

## 2017-02-08 MED ORDER — MAGNESIUM CITRATE PO SOLN
1.0000 | Freq: Once | ORAL | Status: DC | PRN
  Filled 2017-02-08: qty 296

## 2017-02-08 MED ORDER — B COMPLEX-C PO TABS
1.0000 | ORAL_TABLET | Freq: Every day | ORAL | Status: DC
  Administered 2017-02-09 – 2017-02-11 (×3): 1 via ORAL
  Filled 2017-02-08 (×3): qty 1

## 2017-02-08 MED ORDER — ASPIRIN EC 81 MG PO TBEC
81.0000 mg | DELAYED_RELEASE_TABLET | Freq: Every day | ORAL | Status: DC
  Administered 2017-02-09 – 2017-02-11 (×3): 81 mg via ORAL
  Filled 2017-02-08 (×3): qty 1

## 2017-02-08 MED ORDER — DOCUSATE SODIUM 100 MG PO CAPS
100.0000 mg | ORAL_CAPSULE | Freq: Two times a day (BID) | ORAL | Status: DC
  Administered 2017-02-08 – 2017-02-11 (×6): 100 mg via ORAL
  Filled 2017-02-08 (×6): qty 1

## 2017-02-08 MED ORDER — PROBIOTIC COLON SUPPORT PO CAPS: 1.0000 | ORAL_CAPSULE | ORAL | Status: DC

## 2017-02-08 MED ORDER — OMEGA-3-ACID ETHYL ESTERS 1 G PO CAPS
2.0000 g | ORAL_CAPSULE | Freq: Every day | ORAL | Status: DC
  Administered 2017-02-09 – 2017-02-11 (×3): 2 g via ORAL
  Filled 2017-02-08 (×3): qty 2

## 2017-02-08 MED ORDER — LISINOPRIL 20 MG PO TABS
20.0000 mg | ORAL_TABLET | Freq: Every day | ORAL | Status: DC
  Administered 2017-02-09 – 2017-02-11 (×3): 20 mg via ORAL
  Filled 2017-02-08 (×3): qty 1

## 2017-02-08 MED ORDER — PHENYLEPHRINE HCL 10 MG/ML IJ SOLN
INTRAMUSCULAR | Status: AC
  Filled 2017-02-08: qty 1

## 2017-02-08 MED ORDER — VITAMIN E 45 MG (100 UNIT) PO CAPS
1000.0000 [IU] | ORAL_CAPSULE | Freq: Every day | ORAL | Status: DC
  Filled 2017-02-08: qty 2

## 2017-02-08 MED ORDER — VITAMIN B-6 100 MG PO TABS
100.0000 mg | ORAL_TABLET | Freq: Every day | ORAL | Status: DC
  Administered 2017-02-09 – 2017-02-11 (×3): 100 mg via ORAL
  Filled 2017-02-08 (×3): qty 1

## 2017-02-08 MED ORDER — VITAMIN D3 125 MCG (5000 UT) PO CAPS: 1.0000 | ORAL_CAPSULE | ORAL | Status: DC

## 2017-02-08 MED ORDER — DIAZEPAM 5 MG PO TABS
5.0000 mg | ORAL_TABLET | Freq: Two times a day (BID) | ORAL | Status: DC | PRN
  Administered 2017-02-08 – 2017-02-11 (×5): 5 mg via ORAL
  Filled 2017-02-08 (×5): qty 1

## 2017-02-08 MED ORDER — ENOXAPARIN SODIUM 30 MG/0.3ML ~~LOC~~ SOLN
30.0000 mg | Freq: Two times a day (BID) | SUBCUTANEOUS | Status: DC
  Administered 2017-02-09 – 2017-02-11 (×5): 30 mg via SUBCUTANEOUS
  Filled 2017-02-08 (×5): qty 0.3

## 2017-02-08 MED ORDER — CLINDAMYCIN PHOSPHATE 900 MG/50ML IV SOLN
900.0000 mg | Freq: Four times a day (QID) | INTRAVENOUS | Status: AC
  Administered 2017-02-08 (×3): 900 mg via INTRAVENOUS
  Filled 2017-02-08 (×3): qty 50

## 2017-02-08 MED ORDER — DEXAMETHASONE SODIUM PHOSPHATE 10 MG/ML IJ SOLN
INTRAMUSCULAR | Status: AC
  Filled 2017-02-08: qty 1

## 2017-02-08 MED ORDER — KETOROLAC TROMETHAMINE 30 MG/ML IJ SOLN
INTRAMUSCULAR | Status: AC
  Filled 2017-02-08: qty 1

## 2017-02-08 MED ORDER — MAGNESIUM HYDROXIDE 400 MG/5ML PO SUSP
30.0000 mL | Freq: Every day | ORAL | Status: DC | PRN
  Administered 2017-02-08 – 2017-02-10 (×2): 30 mL via ORAL
  Filled 2017-02-08 (×2): qty 30

## 2017-02-08 MED ORDER — PHENOL 1.4 % MT LIQD
1.0000 | OROMUCOSAL | Status: DC | PRN
  Filled 2017-02-08: qty 177

## 2017-02-08 MED ORDER — PSYLLIUM 95 % PO PACK
1.0000 | PACK | Freq: Every day | ORAL | Status: DC
  Administered 2017-02-09 – 2017-02-11 (×3): 1 via ORAL
  Filled 2017-02-08 (×4): qty 1

## 2017-02-08 MED ORDER — ONDANSETRON HCL 4 MG/2ML IJ SOLN
INTRAMUSCULAR | Status: DC | PRN
  Administered 2017-02-08: 4 mg via INTRAVENOUS

## 2017-02-08 MED ORDER — LISINOPRIL-HYDROCHLOROTHIAZIDE 20-12.5 MG PO TABS
1.0000 | ORAL_TABLET | Freq: Every day | ORAL | Status: DC
Start: 2017-02-08 — End: 2017-02-08

## 2017-02-08 MED ORDER — ARTIFICIAL TEARS OPHTHALMIC OINT
TOPICAL_OINTMENT | OPHTHALMIC | Status: AC
  Filled 2017-02-08: qty 3.5

## 2017-02-08 MED ORDER — PHENYLEPHRINE HCL 10 MG/ML IJ SOLN
INTRAMUSCULAR | Status: DC | PRN
  Administered 2017-02-08 (×4): 100 ug via INTRAVENOUS

## 2017-02-08 MED ORDER — FENTANYL CITRATE (PF) 100 MCG/2ML IJ SOLN: 25.0000 ug | INTRAMUSCULAR | Status: DC | PRN

## 2017-02-08 MED ORDER — BISACODYL 10 MG RE SUPP
10.0000 mg | Freq: Every day | RECTAL | Status: DC | PRN
  Administered 2017-02-11: 10 mg via RECTAL
  Filled 2017-02-08: qty 1

## 2017-02-08 MED ORDER — CLINDAMYCIN PHOSPHATE 900 MG/50ML IV SOLN
INTRAVENOUS | Status: AC
  Filled 2017-02-08: qty 50

## 2017-02-08 MED ORDER — METRONIDAZOLE 500 MG PO TABS
500.0000 mg | ORAL_TABLET | Freq: Three times a day (TID) | ORAL | Status: DC
  Administered 2017-02-08 – 2017-02-09 (×2): 500 mg via ORAL
  Filled 2017-02-08: qty 1

## 2017-02-08 MED ORDER — MORPHINE SULFATE (PF) 10 MG/ML IV SOLN
INTRAVENOUS | Status: AC
  Filled 2017-02-08: qty 1

## 2017-02-08 MED ORDER — FENTANYL CITRATE (PF) 100 MCG/2ML IJ SOLN
INTRAMUSCULAR | Status: DC | PRN
  Administered 2017-02-08 (×3): 25 ug via INTRAVENOUS

## 2017-02-08 MED ORDER — FAMOTIDINE 20 MG PO TABS
10.0000 mg | ORAL_TABLET | Freq: Two times a day (BID) | ORAL | Status: DC
  Administered 2017-02-08 – 2017-02-11 (×6): 10 mg via ORAL
  Filled 2017-02-08 (×6): qty 1

## 2017-02-08 MED ORDER — ACETAMINOPHEN 325 MG PO TABS
650.0000 mg | ORAL_TABLET | Freq: Four times a day (QID) | ORAL | Status: DC | PRN
  Administered 2017-02-10 – 2017-02-11 (×3): 650 mg via ORAL
  Filled 2017-02-08 (×3): qty 2

## 2017-02-08 MED ORDER — DICYCLOMINE HCL 20 MG PO TABS
20.0000 mg | ORAL_TABLET | ORAL | Status: DC
  Administered 2017-02-09: 20 mg via ORAL
  Filled 2017-02-08: qty 1

## 2017-02-08 MED ORDER — MORPHINE SULFATE 10 MG/ML IJ SOLN
INTRAMUSCULAR | Status: DC | PRN
  Administered 2017-02-08: 10 mg via INTRAVENOUS

## 2017-02-08 MED ORDER — NEOMYCIN-POLYMYXIN B GU 40-200000 IR SOLN
Status: AC
  Filled 2017-02-08: qty 20

## 2017-02-08 MED ORDER — PROPOFOL 10 MG/ML IV BOLUS
INTRAVENOUS | Status: AC
  Filled 2017-02-08: qty 20

## 2017-02-08 MED ORDER — VITAMIN B-12 ER 1500 MCG PO TBCR: 1500.0000 ug | EXTENDED_RELEASE_TABLET | Freq: Every day | ORAL | Status: DC

## 2017-02-08 MED ORDER — ONDANSETRON HCL 4 MG/2ML IJ SOLN: 4.0000 mg | Freq: Once | INTRAMUSCULAR | Status: DC | PRN

## 2017-02-08 MED ORDER — METOCLOPRAMIDE HCL 10 MG PO TABS: 5.0000 mg | ORAL_TABLET | Freq: Three times a day (TID) | ORAL | Status: DC | PRN

## 2017-02-08 MED ORDER — BUPIVACAINE HCL (PF) 0.5 % IJ SOLN
INTRAMUSCULAR | Status: DC | PRN
  Administered 2017-02-08: 3 mL

## 2017-02-08 MED ORDER — PROPOFOL 500 MG/50ML IV EMUL
INTRAVENOUS | Status: DC | PRN
  Administered 2017-02-08: 25 ug/kg/min via INTRAVENOUS

## 2017-02-08 MED ORDER — METHOCARBAMOL 1000 MG/10ML IJ SOLN
500.0000 mg | Freq: Four times a day (QID) | INTRAVENOUS | Status: DC | PRN
  Filled 2017-02-08: qty 5

## 2017-02-08 MED ORDER — ONDANSETRON HCL 4 MG/2ML IJ SOLN
INTRAMUSCULAR | Status: AC
  Filled 2017-02-08: qty 2

## 2017-02-08 MED ORDER — CRANBERRY 500 MG PO CAPS: 1.0000 | ORAL_CAPSULE | Freq: Every morning | ORAL | Status: DC

## 2017-02-08 MED ORDER — VITAMIN E 45 MG (100 UNIT) PO CAPS
1000.0000 [IU] | ORAL_CAPSULE | Freq: Every day | ORAL | Status: DC
  Administered 2017-02-09 – 2017-02-11 (×3): 1000 [IU] via ORAL
  Filled 2017-02-08 (×3): qty 2

## 2017-02-08 MED ORDER — DEXAMETHASONE SODIUM PHOSPHATE 10 MG/ML IJ SOLN
INTRAMUSCULAR | Status: DC | PRN
  Administered 2017-02-08: 10 mg via INTRAVENOUS

## 2017-02-08 MED ORDER — DICLOFENAC SODIUM 75 MG PO TBEC
75.0000 mg | DELAYED_RELEASE_TABLET | Freq: Two times a day (BID) | ORAL | Status: DC | PRN
  Filled 2017-02-08: qty 1

## 2017-02-08 MED ORDER — MIDAZOLAM HCL 2 MG/2ML IJ SOLN
INTRAMUSCULAR | Status: AC
Start: 2017-02-08 — End: 2017-02-08
  Filled 2017-02-08: qty 2

## 2017-02-08 MED ORDER — VITAMIN D 1000 UNITS PO TABS
5000.0000 [IU] | ORAL_TABLET | Freq: Every day | ORAL | Status: DC
  Administered 2017-02-09 – 2017-02-11 (×3): 5000 [IU] via ORAL
  Filled 2017-02-08 (×3): qty 5

## 2017-02-08 MED ORDER — CALCIUM CARBONATE 600 MG PO TABS: 600.0000 mg | ORAL_TABLET | Freq: Two times a day (BID) | ORAL | Status: DC

## 2017-02-08 MED ORDER — BUPIVACAINE HCL (PF) 0.5 % IJ SOLN
INTRAMUSCULAR | Status: AC
  Filled 2017-02-08: qty 10

## 2017-02-08 SURGICAL SUPPLY — 61 items
BANDAGE ACE 6X5 VEL STRL LF (GAUZE/BANDAGES/DRESSINGS) ×3 IMPLANT
BLADE SAW 1 (BLADE) ×3 IMPLANT
BLOCK CUTTING TIBIAL 3 RT (MISCELLANEOUS) IMPLANT
BLOCK CUTTING TIBIAL 4 RT MIS (MISCELLANEOUS) IMPLANT
CANISTER SUCT 1200ML W/VALVE (MISCELLANEOUS) ×3 IMPLANT
CANISTER SUCT 3000ML PPV (MISCELLANEOUS) ×6 IMPLANT
CAPT KNEE TOTAL 3 ×3 IMPLANT
CATH FOL LEG HOLDER (MISCELLANEOUS) ×3 IMPLANT
CATH TRAY METER 16FR LF (MISCELLANEOUS) ×3 IMPLANT
CEMENT HV SMART SET (Cement) ×6 IMPLANT
CHLORAPREP W/TINT 26ML (MISCELLANEOUS) ×6 IMPLANT
COOLER POLAR GLACIER W/PUMP (MISCELLANEOUS) ×3 IMPLANT
CUFF TOURN 24 STER (MISCELLANEOUS) ×3 IMPLANT
CUFF TOURN 30 STER DUAL PORT (MISCELLANEOUS) IMPLANT
DRAPE SHEET LG 3/4 BI-LAMINATE (DRAPES) ×6 IMPLANT
ELECT CAUTERY BLADE 6.4 (BLADE) ×3 IMPLANT
ELECT REM PT RETURN 9FT ADLT (ELECTROSURGICAL) ×3
ELECTRODE REM PT RTRN 9FT ADLT (ELECTROSURGICAL) ×1 IMPLANT
FEMUR BONE MODEL ×3 IMPLANT
FEMUR CUTTING BLOCK ×3 IMPLANT
GAUZE PETRO XEROFOAM 1X8 (MISCELLANEOUS) ×3 IMPLANT
GAUZE SPONGE 4X4 12PLY STRL (GAUZE/BANDAGES/DRESSINGS) ×3 IMPLANT
GLOVE BIOGEL PI IND STRL 9 (GLOVE) ×1 IMPLANT
GLOVE BIOGEL PI INDICATOR 9 (GLOVE) ×2
GLOVE INDICATOR 8.0 STRL GRN (GLOVE) ×3 IMPLANT
GLOVE SURG ORTHO 8.0 STRL STRW (GLOVE) ×3 IMPLANT
GLOVE SURG SYN 9.0  PF PI (GLOVE) ×2
GLOVE SURG SYN 9.0 PF PI (GLOVE) ×1 IMPLANT
GOWN SRG 2XL LVL 4 RGLN SLV (GOWNS) ×1 IMPLANT
GOWN STRL NON-REIN 2XL LVL4 (GOWNS) ×2
GOWN STRL REUS W/ TWL LRG LVL3 (GOWN DISPOSABLE) ×1 IMPLANT
GOWN STRL REUS W/ TWL XL LVL3 (GOWN DISPOSABLE) ×1 IMPLANT
GOWN STRL REUS W/TWL LRG LVL3 (GOWN DISPOSABLE) ×2
GOWN STRL REUS W/TWL XL LVL3 (GOWN DISPOSABLE) ×2
HOOD PEEL AWAY FLYTE STAYCOOL (MISCELLANEOUS) ×6 IMPLANT
IMMBOLIZER KNEE 19 BLUE UNIV (SOFTGOODS) ×3 IMPLANT
KIT RM TURNOVER STRD PROC AR (KITS) ×3 IMPLANT
KNEE MEDACTA TIBIAL/FEMORAL BL (Knees) ×3 IMPLANT
KNIFE SCULPS 14X20 (INSTRUMENTS) ×3 IMPLANT
NDL SAFETY 18GX1.5 (NEEDLE) ×3 IMPLANT
NEEDLE SPNL 18GX3.5 QUINCKE PK (NEEDLE) ×3 IMPLANT
NEEDLE SPNL 20GX3.5 QUINCKE YW (NEEDLE) ×3 IMPLANT
NS IRRIG 1000ML POUR BTL (IV SOLUTION) ×3 IMPLANT
PACK TOTAL KNEE (MISCELLANEOUS) ×3 IMPLANT
PAD WRAPON POLAR KNEE (MISCELLANEOUS) ×1 IMPLANT
PULSAVAC PLUS IRRIG FAN TIP (DISPOSABLE) ×3
SOL .9 NS 3000ML IRR  AL (IV SOLUTION) ×2
SOL .9 NS 3000ML IRR UROMATIC (IV SOLUTION) ×1 IMPLANT
STAPLER SKIN PROX 35W (STAPLE) ×3 IMPLANT
SUCTION FRAZIER HANDLE 10FR (MISCELLANEOUS) ×2
SUCTION TUBE FRAZIER 10FR DISP (MISCELLANEOUS) ×1 IMPLANT
SUT DVC 2 QUILL PDO  T11 36X36 (SUTURE) ×2
SUT DVC 2 QUILL PDO T11 36X36 (SUTURE) ×1 IMPLANT
SUT ETHIBOND NAB CT1 #1 30IN (SUTURE) ×3 IMPLANT
SUT V-LOC 90 ABS DVC 3-0 CL (SUTURE) ×3 IMPLANT
SYR 20CC LL (SYRINGE) ×3 IMPLANT
SYR 50ML LL SCALE MARK (SYRINGE) ×6 IMPLANT
TIP FAN IRRIG PULSAVAC PLUS (DISPOSABLE) ×1 IMPLANT
TOWEL OR 17X26 4PK STRL BLUE (TOWEL DISPOSABLE) ×3 IMPLANT
TOWER CARTRIDGE SMART MIX (DISPOSABLE) ×3 IMPLANT
WRAPON POLAR PAD KNEE (MISCELLANEOUS) ×3

## 2017-02-08 NOTE — Transfer of Care (Signed)
Immediate Anesthesia Transfer of Care Note  Patient: Holly Robbins  Procedure(s) Performed: Procedure(s): TOTAL KNEE ARTHROPLASTY (Right)  Patient Location: PACU  Anesthesia Type:Spinal  Level of Consciousness: awake  Airway & Oxygen Therapy: Patient Spontanous Breathing and Patient connected to nasal cannula oxygen  Post-op Assessment: Report given to RN and Post -op Vital signs reviewed and stable  Post vital signs: Reviewed and stable  Last Vitals:  Vitals:   02/08/17 0955  BP: (!) 96/54  Pulse: 64  Resp: 11  Temp: 36.5 C    Last Pain: There were no vitals filed for this visit.       Complications: No apparent anesthesia complications

## 2017-02-08 NOTE — Anesthesia Procedure Notes (Signed)
Spinal  Patient location during procedure: OR Start time: 02/08/2017 7:25 AM End time: 02/08/2017 7:40 AM Staffing Performed: anesthesiologist  Preanesthetic Checklist Completed: patient identified, site marked, surgical consent, pre-op evaluation, timeout performed, IV checked, risks and benefits discussed and monitors and equipment checked Spinal Block Patient position: sitting Prep: Betadine Patient monitoring: heart rate, continuous pulse ox, blood pressure and cardiac monitor Approach: midline Location: L3-4 Injection technique: single-shot Needle Needle type: Whitacre and Introducer  Needle gauge: 24 G Needle length: 9 cm Additional Notes Pt with significant scoliosis. After several attempts by the CRNA, Dr. Ronelle Nigh attempted with a farther left approach. Successful on 2nd pass.Negative paresthesia. Negative blood return. Positive free-flowing CSF. Expiration date of kit checked and confirmed. Patient tolerated procedure well, without complications.

## 2017-02-08 NOTE — Anesthesia Post-op Follow-up Note (Cosign Needed)
Anesthesia QCDR form completed.        

## 2017-02-08 NOTE — Anesthesia Preprocedure Evaluation (Signed)
Anesthesia Evaluation  Patient identified by MRN, date of birth, ID band Patient awake    Reviewed: Allergy & Precautions, NPO status , Patient's Chart, lab work & pertinent test results  History of Anesthesia Complications (+) POST - OP SPINAL HEADACHE and history of anesthetic complications (pt unsure about the spinal HA)  Airway Mallampati: II       Dental  (+) Partial Upper, Chipped, Missing   Pulmonary neg pulmonary ROS, former smoker,           Cardiovascular hypertension, Pt. on medications      Neuro/Psych negative neurological ROS     GI/Hepatic Neg liver ROS, hiatal hernia, GERD  Medicated,  Endo/Other  negative endocrine ROS  Renal/GU negative Renal ROS     Musculoskeletal   Abdominal   Peds  Hematology   Anesthesia Other Findings   Reproductive/Obstetrics                             Anesthesia Physical Anesthesia Plan  ASA: II  Anesthesia Plan: Spinal   Post-op Pain Management:    Induction:   PONV Risk Score and Plan:   Airway Management Planned:   Additional Equipment:   Intra-op Plan:   Post-operative Plan:   Informed Consent: I have reviewed the patients History and Physical, chart, labs and discussed the procedure including the risks, benefits and alternatives for the proposed anesthesia with the patient or authorized representative who has indicated his/her understanding and acceptance.     Plan Discussed with:   Anesthesia Plan Comments:         Anesthesia Quick Evaluation

## 2017-02-08 NOTE — Evaluation (Signed)
Physical Therapy Evaluation Patient Details Name: Holly Robbins MRN: [id number] DOB: [birth year] Today's Date: 02/08/2017   History of Present Illness  Pt diagnosed with primary OA of R knee and is s/p elective R TKA.  Clinical Impression  Pt presents with deficits in strength, transfers, mobility, gait, R knee ROM, balance, and activity tolerance but overall performed well considering recency of surgery.  Pt SBA with sup to sit with extra time and effort required.  Pt CGA with transfers with verbal cues for proper sequencing.  Pt able to amb 3' from bed to chair with RW and CGA with antalgic step-to pattern and heavy use of BUEs secondary to R knee pain with WB.  Overall pt steady with WB with good concentric and eccentric control of R knee with functional mobility tasks.  Pt education provided regarding positioning, avoiding closed kinetic chain R knee twisting during turns, and HEP expectations.  Pt will benefit from HHPT services upon discharge to address above deficits for decreased caregiver assistance and return to PLOF.      Follow Up Recommendations Home health PT    Equipment Recommendations  None recommended by PT    Recommendations for Other Services       Precautions / Restrictions Precautions Precautions: Fall Restrictions Weight Bearing Restrictions: Yes RLE Weight Bearing: Weight bearing as tolerated Other Position/Activity Restrictions: Pt able to perform 10 independent RLE SLRs without extensor lag, no KI required      Mobility  Bed Mobility Overal bed mobility: Needs Assistance Bed Mobility: Supine to Sit     Supine to sit: Supervision     General bed mobility comments: Extra time and effort required during sup to sit but no physical assistance needed  Transfers Overall transfer level: Needs assistance Equipment used: Rolling walker (2 wheeled) Transfers: Sit to/from Stand Sit to Stand: Min guard         General transfer comment: Min verbal cues  for proper sequencing with transfers  Ambulation/Gait Ambulation/Gait assistance: Min guard Ambulation Distance (Feet): 3 Feet Assistive device: Rolling walker (2 wheeled) Gait Pattern/deviations: Step-to pattern;Antalgic;Trunk flexed   Gait velocity interpretation: Below normal speed for age/gender General Gait Details: Step-to pattern with heavy use of BUEs secondary to antalgic gait on RLE  Stairs Stairs:  (Deferred)          Wheelchair Mobility    Modified Rankin (Stroke Patients Only)       Balance Overall balance assessment: Needs assistance Sitting-balance support: No upper extremity supported;Feet supported Sitting balance-Leahy Scale: Good     Standing balance support: Bilateral upper extremity supported Standing balance-Leahy Scale: Good                               Pertinent Vitals/Pain Pain Assessment: 0-10 Pain Score: 4  Pain Location: R knee Pain Descriptors / Indicators: Aching;Sore Pain Intervention(s): Premedicated before session;Monitored during session;Limited activity within patient's tolerance    Home Living Family/patient expects to be discharged to:: Private residence Living Arrangements: Spouse/significant other Available Help at Discharge: Family;Available 24 hours/day Type of Home: House Home Access: Stairs to enter Entrance Stairs-Rails: Right;Left (Too wide for both) Entrance Stairs-Number of Steps: 2 Home Layout: One level Home Equipment: Walker - 2 wheels;Walker - 4 wheels;Cane - quad      Prior Function Level of Independence: Independent with assistive device(s)         Comments: Pt Mod Ind with amb in home with rollator and without  AD in community, no fall history; pt Ind with ADLs     Hand Dominance   Dominant Hand: Right    Extremity/Trunk Assessment   Upper Extremity Assessment Upper Extremity Assessment: Overall WFL for tasks assessed    Lower Extremity Assessment Lower Extremity Assessment:  Generalized weakness;RLE deficits/detail RLE: Unable to fully assess due to pain RLE Sensation:  (Sensation to light touch grossly intact to BLEs)       Communication   Communication: No difficulties  Cognition Arousal/Alertness: Awake/alert Behavior During Therapy: WFL for tasks assessed/performed Overall Cognitive Status: Within Functional Limits for tasks assessed                                        General Comments      Exercises Total Joint Exercises Ankle Circles/Pumps: AROM;Both;5 reps;10 reps Quad Sets: Strengthening;Both;5 reps;10 reps Gluteal Sets: Strengthening;Both;10 reps Hip ABduction/ADduction: AROM;Both;5 reps Straight Leg Raises: AROM;Both;10 reps Long Arc Quad: AROM;Right;10 reps;15 reps Knee Flexion: AROM;Right;10 reps;15 reps Goniometric ROM: R knee A/AAROM: flex 85/91 deg, ext -8/-3 deg Marching in Standing: AROM;Both;10 reps Other Exercises Other Exercises: TKA booklet provided and exercises reviewed, questions answered.   Assessment/Plan    PT Assessment Patient needs continued PT services  PT Problem List Decreased strength;Decreased range of motion;Decreased activity tolerance;Decreased balance;Decreased knowledge of use of DME;Decreased mobility       PT Treatment Interventions DME instruction;Gait training;Stair training;Functional mobility training;Neuromuscular re-education;Balance training;Therapeutic exercise;Therapeutic activities;Patient/family education    PT Goals (Current goals can be found in the Care Plan section)  Acute Rehab PT Goals Patient Stated Goal: To walk better PT Goal Formulation: With patient Time For Goal Achievement: 02/21/17 Potential to Achieve Goals: Good    Frequency BID   Barriers to discharge        Co-evaluation               AM-PAC PT "6 Clicks" Daily Activity  Outcome Measure Difficulty turning over in bed (including adjusting bedclothes, sheets and blankets)?: A  Little Difficulty moving from lying on back to sitting on the side of the bed? : A Little Difficulty sitting down on and standing up from a chair with arms (e.g., wheelchair, bedside commode, etc,.)?: Total Help needed moving to and from a bed to chair (including a wheelchair)?: A Little Help needed walking in hospital room?: A Lot Help needed climbing 3-5 steps with a railing? : A Lot 6 Click Score: 14    End of Session Equipment Utilized During Treatment: Gait belt Activity Tolerance: Patient tolerated treatment well Patient left: in chair;with chair alarm set;Other (comment);with call bell/phone within reach;with family/visitor present (Polar care donned to R knee) Nurse Communication: Mobility status PT Visit Diagnosis: Other abnormalities of gait and mobility (R26.89);Muscle weakness (generalized) (M62.81)    Time: 1610-9604 PT Time Calculation (min) (ACUTE ONLY): 52 min   Charges:   PT Evaluation $PT Eval Low Complexity: 1 Procedure PT Treatments $Therapeutic Exercise: 8-22 mins $Therapeutic Activity: 8-22 mins   PT G Codes:        DElly Modena PT, DPT 02/08/17, 4:03 PM

## 2017-02-08 NOTE — H&P (Signed)
Reviewed paper H+P, will be scanned into chart. No changes noted.  

## 2017-02-08 NOTE — NC FL2 (Signed)
[place] MEDICAID FL2 LEVEL OF CARE SCREENING TOOL     IDENTIFICATION  Patient Name: Holly Robbins Birthdate: 12-27-1940 Sex: female Admission Date (Current Location): 02/08/2017  [place] and [place] Number:  Chiropodist and Address:  [facility], [address] [place], [place] [address]      Provider Number: [id number]  Attending Physician Name and Address:  Kennedy Bucker, MD  Relative Name and Phone Number:       Current Level of Care: Hospital Recommended Level of Care: Skilled Nursing Facility Prior Approval Number:    Date Approved/Denied:   PASRR Number:  [id number])  Discharge Plan: SNF    Current Diagnoses: Patient Active Problem List   Diagnosis Date Noted  . Primary osteoarthritis of right knee 02/08/2017  . Arthritis 09/28/2016  . GERD (gastroesophageal reflux disease) 09/28/2016  . History of hematuria 09/28/2016  . Hyperlipidemia, unspecified 09/28/2016  . Hypertension 09/28/2016  . IBS (irritable bowel syndrome) 09/28/2016  . Psoriasis 09/28/2016  . DDD (degenerative disc disease), thoracolumbar 04/18/2014    Orientation RESPIRATION BLADDER Height & Weight     Self, Time, Situation, Place  Normal Continent Weight:   Height:     BEHAVIORAL SYMPTOMS/MOOD NEUROLOGICAL BOWEL NUTRITION STATUS   (none)  (none) Continent Diet (Diet: Heart Healthy )  AMBULATORY STATUS COMMUNICATION OF NEEDS Skin   Extensive Assist Verbally Surgical wounds (Incision: Right Knee )                       Personal Care Assistance Level of Assistance  Bathing, Feeding, Dressing Bathing Assistance: Limited assistance Feeding assistance: Independent Dressing Assistance: Limited assistance     Functional Limitations Info  Sight, Hearing, Speech Sight Info: Adequate Hearing Info: Adequate Speech Info: Adequate    SPECIAL CARE FACTORS FREQUENCY  PT (By licensed PT), OT (By licensed OT)     PT Frequency:  (5) OT  Frequency:  (5)            Contractures      Additional Factors Info  Code Status, Allergies Code Status Info:  (Full Code. ) Allergies Info:  ( Adhesive Tape, Atorvastatin, Contrast Media Iodinated Diagnostic Agents, Eggs Or Egg-derived Products, Erythromycin, Latex, Penicillins)           Current Medications (02/08/2017):  This is the current hospital active medication list Current Facility-Administered Medications  Medication Dose Route Frequency Provider Last Rate Last Dose  . 0.9 %  sodium chloride infusion   Intravenous Continuous Kennedy Bucker, MD      . acetaminophen (TYLENOL) tablet 650 mg  650 mg Oral Q6H PRN Kennedy Bucker, MD       Or  . acetaminophen (TYLENOL) suppository 650 mg  650 mg Rectal Q6H PRN Kennedy Bucker, MD      . alum & mag hydroxide-simeth (MAALOX/MYLANTA) 200-200-20 MG/5ML suspension 30 mL  30 mL Oral Q4H PRN Kennedy Bucker, MD   30 mL at 02/08/17 1343  . [START ON 02/09/2017] aspirin EC tablet 81 mg  81 mg Oral Daily Kennedy Bucker, MD      . Melene Muller ON 02/09/2017] B-complex with vitamin C tablet 1 tablet  1 tablet Oral Daily Kennedy Bucker, MD      . bisacodyl (DULCOLAX) suppository 10 mg  10 mg Rectal Daily PRN Kennedy Bucker, MD      . calcium carbonate (OS-CAL - dosed in mg of elemental calcium) tablet 500 mg of elemental calcium  500 mg of elemental calcium  Oral BID WC Kennedy Bucker, MD      . Melene Muller ON 02/09/2017] cholecalciferol (VITAMIN D) tablet 5,000 Units  5,000 Units Oral Daily Kennedy Bucker, MD      . clindamycin (CLEOCIN) IVPB 900 mg  900 mg Intravenous Q6H Kennedy Bucker, MD   Stopped at 02/08/17 1235  . diazepam (VALIUM) tablet 5 mg  5 mg Oral BID PRN Kennedy Bucker, MD      . diclofenac (VOLTAREN) EC tablet 75 mg  75 mg Oral BID PRN Kennedy Bucker, MD      . dicyclomine (BENTYL) tablet 20 mg  20 mg Oral See admin instructions Kennedy Bucker, MD      . dicyclomine (BENTYL) tablet 20 mg  20 mg Oral Daily PRN Kennedy Bucker, MD      . diphenhydrAMINE  (BENADRYL) 12.5 MG/5ML elixir 12.5-25 mg  12.5-25 mg Oral Q4H PRN Kennedy Bucker, MD      . docusate sodium (COLACE) capsule 100 mg  100 mg Oral BID Kennedy Bucker, MD      . Melene Muller ON 02/09/2017] enoxaparin (LOVENOX) injection 30 mg  30 mg Subcutaneous Q12H Kennedy Bucker, MD      . famotidine (PEPCID) tablet 10 mg  10 mg Oral BID Kennedy Bucker, MD      . lisinopril (PRINIVIL,ZESTRIL) tablet 20 mg  20 mg Oral Daily Kennedy Bucker, MD       And  . hydrochlorothiazide (MICROZIDE) capsule 12.5 mg  12.5 mg Oral Daily Kennedy Bucker, MD      . magnesium citrate solution 1 Bottle  1 Bottle Oral Once PRN Kennedy Bucker, MD      . magnesium hydroxide (MILK OF MAGNESIA) suspension 30 mL  30 mL Oral Daily PRN Kennedy Bucker, MD   30 mL at 02/08/17 1344  . [START ON 02/09/2017] Magnesium TABS 200 mg  1 tablet Oral Daily Kennedy Bucker, MD      . menthol-cetylpyridinium (CEPACOL) lozenge 3 mg  1 lozenge Oral PRN Kennedy Bucker, MD       Or  . phenol (CHLORASEPTIC) mouth spray 1 spray  1 spray Mouth/Throat PRN Kennedy Bucker, MD      . methocarbamol (ROBAXIN) tablet 500 mg  500 mg Oral Q6H PRN Kennedy Bucker, MD       Or  . methocarbamol (ROBAXIN) 500 mg in dextrose 5 % 50 mL IVPB  500 mg Intravenous Q6H PRN Kennedy Bucker, MD      . metoCLOPramide (REGLAN) tablet 5-10 mg  5-10 mg Oral Q8H PRN Kennedy Bucker, MD       Or  . metoCLOPramide (REGLAN) injection 5-10 mg  5-10 mg Intravenous Q8H PRN Kennedy Bucker, MD      . metroNIDAZOLE (FLAGYL) tablet 500 mg  500 mg Oral TID Kennedy Bucker, MD      . morphine 2 MG/ML injection 2 mg  2 mg Intravenous Q2H PRN Kennedy Bucker, MD      . Melene Muller ON 02/09/2017] multivitamin with minerals tablet 1 tablet  1 tablet Oral Daily Kennedy Bucker, MD      . Melene Muller ON 02/09/2017] omega-3 acid ethyl esters (LOVAZA) capsule 2 g  2 g Oral Daily Kennedy Bucker, MD      . ondansetron [facility]) tablet 4 mg  4 mg Oral Q6H PRN Kennedy Bucker, MD       Or  . ondansetron [facility]) injection 4 mg  4 mg  Intravenous Q6H PRN Kennedy Bucker, MD      . oxyCODONE (Oxy IR/ROXICODONE) immediate  release tablet 5-10 mg  5-10 mg Oral Q3H PRN Kennedy BuckerMenz, Michael, MD   5 mg at 02/08/17 1203  . pravastatin (PRAVACHOL) tablet 20 mg  20 mg Oral Daily Kennedy BuckerMenz, Michael, MD      . psyllium (HYDROCIL/METAMUCIL) packet 1 packet  1 packet Oral Daily Kennedy BuckerMenz, Michael, MD      . Melene Muller[START ON 02/09/2017] pyridOXINE (VITAMIN B-6) tablet 100 mg  100 mg Oral Daily Kennedy BuckerMenz, Michael, MD      . Melene Muller[START ON 02/09/2017] vitamin B-12 (CYANOCOBALAMIN) tablet 1,500 mcg  1,500 mcg Oral Daily Kennedy BuckerMenz, Michael, MD      . Melene Muller[START ON 02/09/2017] vitamin E capsule 1,000 Units  1,000 Units Oral Daily Kennedy BuckerMenz, Michael, MD      . zolpidem Remus Loffler(AMBIEN) tablet 5 mg  5 mg Oral QHS PRN Kennedy BuckerMenz, Michael, MD         Discharge Medications: Please see discharge summary for a list of discharge medications.  Relevant Imaging Results:  Relevant Lab Results:   Additional Information  (SSN: [id number])  Ying Blankenhorn, Darleen CrockerBailey M, LCSW

## 2017-02-08 NOTE — Anesthesia Procedure Notes (Signed)
Date/Time: 02/08/2017 7:45 AM Performed by: Henrietta HooverPOPE, Avier Jech Pre-anesthesia Checklist: Patient identified, Emergency Drugs available, Suction available, Patient being monitored and Timeout performed Patient Re-evaluated:Patient Re-evaluated prior to induction Oxygen Delivery Method: Nasal cannula Placement Confirmation: positive ETCO2

## 2017-02-08 NOTE — Op Note (Signed)
02/08/2017  9:54 AM  PATIENT:  Holly Robbins  76 y.o. female  PRE-OPERATIVE DIAGNOSIS:  PRIMARY OSTEOARTHRITIS OF RIGHT KNEE  POST-OPERATIVE DIAGNOSIS:  PRIMARY OSTEOARTHRITIS OF RIGHT KNEE  PROCEDURE:  Procedure(s): TOTAL KNEE ARTHROPLASTY (Right)  SURGEON: Leitha SchullerMichael J Rupert Azzara, MD  ASSISTANTS: None  ANESTHESIA:   spinal  EBL:  Total I/O In: 600 [I.V.:500; IV Piggyback:100] Out: 500 [Urine:300; Blood:200]  BLOOD ADMINISTERED:none  DRAINS: none   LOCAL MEDICATIONS USED:  MARCAINE    and OTHER Exparel and morphine  SPECIMEN:  No Specimen  DISPOSITION OF SPECIMEN:  N/A  COUNTS:  YES  TOURNIQUET:   tourniquet time 25 minutes at 300 mmHg  IMPLANTS: Medacta GMK sphere right 3+ femur 3 tibia with short stem 10 mm insert and 2 patella all components cemented  DICTATION: .Dragon Dictation patient was brought the operating room and after adequate spinal anesthesia was obtained the right leg was prepped and draped in sterile fashion. After patient identification and timeout procedures were completed, a midline skin incision was made followed by medial part patellar arthrotomy. The knee showed extensive wear particularly the medial tibia and lateral femoral condyles with extensive spurs in the notch and lateral femur as well as significant bone loss of patella. The medial capsule was elevated to allow for application of the my knee cutting block and proximal tibia cut carried out. Followed by the femoral cut in a similar fashion. Distal femoral jig was applied anterior posterior chamfer cuts made with no notching. There is also is quite a bit of synovitis present and this was excised. With the anterior cruciate ligament and PCL excised the meniscus was then removed from the medial and lateral side and the tibia brought forward with a 3 template placed appropriate proximal tibial preparation was carried out with a drill and awl followed by keel punch the 3+ femur was placed and the 10 mm  insert gave good stability with full extension. The distal femoral drill holes were made along the notch cut for the trochlear groove. Those trials were removed and the patella was cut with freehand technique since her been somewhat where down to about 13 mm in thickness at which point drill holes were made and a size 2 gave good coverage. A lateral release was required because of the subluxation of the patella. This is all done without the tourniquet this point the tourniquet was raised after having injected the above local anesthetic. The bony surfaces were thoroughly irrigated and dried and the tibial components cemented in place first followed by the polyethylene for the tibia. Set screw was tightened with the torque wrench. The distal femoral component was cemented into place and the knee held in extension as the set with patellar button clamped into place. After the cemented set excess cement was removed and the patella tracked well with no touch technique the knee was thoroughly irrigated with pulsatile lavage and tourniquet let down for wound closure. Ethibond was used to do initial medial retinacular repair with the prior lateral release followed by a heavy Quill.3-0 v-loc subcutaneously followed by skin staples. Xeroform 4 x 4 ABDs web roll Polar Care and Ace wrap applied  PLAN OF CARE: Admit to inpatient   PATIENT DISPOSITION:  PACU - hemodynamically stable.

## 2017-02-08 NOTE — Anesthesia Procedure Notes (Signed)
Spinal  Start time: 02/08/2017 7:35 AM End time: 02/08/2017 7:45 AM Staffing Anesthesiologist: Naomie DeanKEPHART, WILLIAM K Resident/CRNA: Henrietta HooverPOPE, Jaythen Hamme Performed: anesthesiologist  Preanesthetic Checklist Completed: patient identified, site marked, surgical consent, pre-op evaluation, timeout performed, IV checked, risks and benefits discussed and monitors and equipment checked Spinal Block Patient position: sitting Prep: Betadine Patient monitoring: heart rate, continuous pulse ox and blood pressure Location: L4-5 Injection technique: single-shot Needle Needle type: Pencan  Needle gauge: 24 G Needle length: 5 cm Catheter type: closed end flexible Assessment Sensory level: T4 Additional Notes 1 attempt by myself after prep times 3 with betadine scrub and local 1% at L4-5.  Used introducer.  Dr Henrene HawkingKephart moved to the left 1" of my attempts.  Positive return of clear spinal fluid.  3cc  Of 0.5% bupivacaine used.

## 2017-02-09 ENCOUNTER — Encounter: Payer: Self-pay | Admitting: Orthopedic Surgery

## 2017-02-09 LAB — CBC
HCT: 34.5 % — ABNORMAL LOW (ref 35.0–47.0)
HEMOGLOBIN: 12 g/dL (ref 12.0–16.0)
MCH: 31.6 pg (ref 26.0–34.0)
MCHC: 34.7 g/dL (ref 32.0–36.0)
MCV: 91.1 fL (ref 80.0–100.0)
PLATELETS: 311 10*3/uL (ref 150–440)
RBC: 3.79 MIL/uL — AB (ref 3.80–5.20)
RDW: 14 % (ref 11.5–14.5)
WBC: 14.5 10*3/uL — AB (ref 3.6–11.0)

## 2017-02-09 LAB — BASIC METABOLIC PANEL
ANION GAP: 8 (ref 5–15)
BUN: 14 mg/dL (ref 6–20)
CHLORIDE: 103 mmol/L (ref 101–111)
CO2: 28 mmol/L (ref 22–32)
Calcium: 9 mg/dL (ref 8.9–10.3)
Creatinine, Ser: 0.68 mg/dL (ref 0.44–1.00)
GFR calc non Af Amer: >60 mL/min (ref >60)
Glucose, Bld: 108 mg/dL — ABNORMAL HIGH (ref 65–99)
POTASSIUM: 4.3 mmol/L (ref 3.5–5.1)
SODIUM: 139 mmol/L (ref 135–145)

## 2017-02-09 MED ORDER — CALCIUM CARBONATE ANTACID 500 MG PO CHEW
1.0000 | CHEWABLE_TABLET | Freq: Two times a day (BID) | ORAL | Status: DC
  Administered 2017-02-09 – 2017-02-11 (×5): 200 mg via ORAL
  Filled 2017-02-09 (×5): qty 1

## 2017-02-09 MED ORDER — MAGNESIUM OXIDE 400 (241.3 MG) MG PO TABS
200.0000 mg | ORAL_TABLET | Freq: Every day | ORAL | Status: DC
  Administered 2017-02-09 – 2017-02-11 (×3): 200 mg via ORAL
  Filled 2017-02-09 (×3): qty 1

## 2017-02-09 NOTE — Progress Notes (Signed)
Volume 450 per bladder scan reading. [facility] but voids small amounts.

## 2017-02-09 NOTE — Progress Notes (Signed)
Physical Therapy Treatment Patient Details Name: Holly Robbins MRN: [id number] DOB: Aug 20, 1940 Today's Date: 02/09/2017    History of Present Illness Pt diagnosed with primary OA of R knee and is s/p elective R TKA.    PT Comments    Pt presents with deficits in strength, transfers, mobility, gait, R knee ROM, balance, and activity tolerance.  Pt continues to require extra time and effort with bed mobility tasks but no physical assistance.  Pt effortful with sit to/from stand with min-mod verbal cues needed for sequencing.  Pt able to amb 2 x 10' with RW and CGA with step-to antalgic gait on R and heavy use of BUEs on RW.  Pt fatigued and limited by pain with gait and reports no sleep the prior evening, nursing aware.  Pt will benefit from PT services to address above deficits for decreased caregiver assistance upon discharge. Goal remains to discharge home with HHPT although will need to see improvement in ambulation and safe demonstration of ability to ascend and descend stairs to ensure safe discharge home.       Follow Up Recommendations  Home health PT     Equipment Recommendations  None recommended by PT    Recommendations for Other Services       Precautions / Restrictions Precautions Precautions: Fall Restrictions Weight Bearing Restrictions: No RLE Weight Bearing: Weight bearing as tolerated Other Position/Activity Restrictions: Pt able to perform 10 independent RLE SLRs without extensor lag, no KI required    Mobility  Bed Mobility Overal bed mobility: Needs Assistance Bed Mobility: Supine to Sit     Supine to sit: Supervision     General bed mobility comments: Extra time and effort required during sup to sit but no physical assistance needed  Transfers Overall transfer level: Needs assistance Equipment used: Rolling walker (2 wheeled) Transfers: Sit to/from Stand Sit to Stand: Min guard         General transfer comment: Min verbal cues for proper  sequencing with transfers  Ambulation/Gait Ambulation/Gait assistance: Min guard Ambulation Distance (Feet): 10 Feet Assistive device: Rolling walker (2 wheeled) Gait Pattern/deviations: Step-to pattern;Antalgic;Trunk flexed   Gait velocity interpretation: Below normal speed for age/gender General Gait Details: Step-to pattern with heavy use of BUEs secondary to antalgic gait on RLE; pt able to amb 2 x 10' but remains limited by R knee pain and fatigue, pt reports no sleep previous night, nsg aware   Stairs Stairs:  (Deferred)          Wheelchair Mobility    Modified Rankin (Stroke Patients Only)       Balance Overall balance assessment: Needs assistance Sitting-balance support: No upper extremity supported;Feet supported Sitting balance-Leahy Scale: Good     Standing balance support: Bilateral upper extremity supported Standing balance-Leahy Scale: Good                              Cognition Arousal/Alertness: Awake/alert Behavior During Therapy: WFL for tasks assessed/performed Overall Cognitive Status: Within Functional Limits for tasks assessed                                        Exercises Total Joint Exercises Ankle Circles/Pumps: AROM;Both;5 reps;10 reps Quad Sets: Strengthening;10 reps;Right;15 reps Gluteal Sets: Strengthening;Both;10 reps Heel Slides: AAROM;Right;5 reps;10 reps Hip ABduction/ADduction: AROM;Both;10 reps Straight Leg Raises: AROM;Both;10 reps Long Arc Quad:  AROM;Right;5 reps;10 reps;15 reps Knee Flexion: AROM;Right;5 reps;10 reps;15 reps Goniometric ROM: R knee A/AAROM: flex 83/92 deg, ext -10/-8 deg Marching in Standing: AROM;Both;10 reps Other Exercises Other Exercises: HEP education/review with emphasis on R knee flex/ext AROM Other Exercises: Positioning education/review to encourage R knee ext while at rest in chair or bed    General Comments        Pertinent Vitals/Pain Pain Assessment:  0-10 Pain Score: 3  Pain Location: R knee Pain Descriptors / Indicators: Aching;Sore Pain Intervention(s): Premedicated before session;Monitored during session;Limited activity within patient's tolerance    Home Living                      Prior Function            PT Goals (current goals can now be found in the care plan section) Progress towards PT goals: Progressing toward goals    Frequency    BID      PT Plan Current plan remains appropriate    Co-evaluation              AM-PAC PT "6 Clicks" Daily Activity  Outcome Measure  Difficulty turning over in bed (including adjusting bedclothes, sheets and blankets)?: A Little Difficulty moving from lying on back to sitting on the side of the bed? : A Little Difficulty sitting down on and standing up from a chair with arms (e.g., wheelchair, bedside commode, etc,.)?: Total Help needed moving to and from a bed to chair (including a wheelchair)?: A Little Help needed walking in hospital room?: A Lot Help needed climbing 3-5 steps with a railing? : A Lot 6 Click Score: 14    End of Session Equipment Utilized During Treatment: Gait belt Activity Tolerance: Patient limited by fatigue;Patient limited by pain Patient left: in chair;with chair alarm set;with family/visitor present;with call bell/phone within reach (Polar care donned to R knee) Nurse Communication: Mobility status PT Visit Diagnosis: Other abnormalities of gait and mobility (R26.89);Muscle weakness (generalized) (M62.81)     Time: 0981-1914 PT Time Calculation (min) (ACUTE ONLY): 40 min  Charges:  $Gait Training: 8-22 mins $Therapeutic Exercise: 8-22 mins $Therapeutic Activity: 8-22 mins                    G Codes:       D. Elly Modena PT, DPT 02/09/17, 11:07 AM

## 2017-02-09 NOTE — Anesthesia Postprocedure Evaluation (Signed)
Anesthesia Post Note  Patient: Holly Robbins  Procedure(s) Performed: Procedure(s) (LRB): TOTAL KNEE ARTHROPLASTY (Right)  Patient location during evaluation: Nursing Unit Anesthesia Type: Spinal Level of consciousness: awake and alert and oriented Pain management: satisfactory to patient Vital Signs Assessment: post-procedure vital signs reviewed and stable Respiratory status: respiratory function stable Cardiovascular status: stable Postop Assessment: no headache, no backache, spinal receding, patient able to bend at knees, no signs of nausea or vomiting and adequate PO intake Anesthetic complications: no     Last Vitals:  Vitals:   02/08/17 2300 02/09/17 0431  BP: (!) 121/58 135/66  Pulse: 79 82  Resp: 18 18  Temp: 37.1 C 37.4 C    Last Pain:  Vitals:   02/09/17 0628  TempSrc:   PainSc: 7                  Clydene PughBeane, Oriya Kettering D

## 2017-02-09 NOTE — Progress Notes (Signed)
Subjective: 1 Day Post-Op Procedure(s) (LRB): TOTAL KNEE ARTHROPLASTY (Right) Patient reports pain as moderate.   Patient is well, and has had no acute complaints or problems Care management to assist with discharge planning. Negative for chest pain and shortness of breath Fever: Temp 99.3 last night. Gastrointestinal:Negative for nausea and vomiting  Objective: Vital signs in last 24 hours: Temp:  [97.4 F (36.3 C)-99.3 F (37.4 C)] 99.3 F (37.4 C) (07/18 0431) Pulse Rate:  [56-82] 82 (07/18 0431) Resp:  [11-40] 18 (07/18 0431) BP: (96-142)/(45-120) 135/66 (07/18 0431) SpO2:  [95 %-100 %] 98 % (07/18 0431) Weight:  [77.1 kg (170 lb)] 77.1 kg (170 lb) (07/17 1140)  Intake/Output from previous day:  Intake/Output Summary (Last 24 hours) at 02/09/17 0759 Last data filed at 02/09/17 [id number]  Gross per 24 hour  Intake          2531.25 ml  Output             3475 ml  Net          -943.75 ml    Intake/Output this shift: No intake/output data recorded.  Labs:  Recent Labs  02/08/17 1143 02/09/17 0525  HGB 13.2 12.0    Recent Labs  02/08/17 1143 02/09/17 0525  WBC 10.2 14.5*  RBC 4.13 3.79*  HCT 37.7 34.5*  PLT 314 311    Recent Labs  02/08/17 1143 02/09/17 0525  NA  --  139  K  --  4.3  CL  --  103  CO2  --  28  BUN  --  14  CREATININE 0.71 0.68  GLUCOSE  --  108*  CALCIUM  --  9.0   No results for input(s): LABPT, INR in the last 72 hours.   EXAM General - Patient is Alert, Appropriate and Oriented Extremity - Neurovascular intact Sensation intact distally Dorsiflexion/Plantar flexion intact Incision: dressing C/D/I No cellulitis present Dressing/Incision - Bulky dressing intact today. Motor Function - intact, moving foot and toes well on exam.   Abdomen soft on exam, normal BS without tympany.  Past Medical History:  Diagnosis Date  . Arthritis   . GERD (gastroesophageal reflux disease)   . History of hiatal hernia   . Hypertension   .  Spinal headache 1956   block for delivery of son    Assessment/Plan: 1 Day Post-Op Procedure(s) (LRB): TOTAL KNEE ARTHROPLASTY (Right) Active Problems:   Primary osteoarthritis of right knee  Estimated body mass index is 30.11 kg/m as calculated from the following:   Height as of this encounter: 5\' 3"  (1.6 m).   Weight as of this encounter: 77.1 kg (170 lb). Advance diet Up with therapy D/C IV fluids when tolerating po intake.  Labs reviewed, WBC 14.5, Temp 99.3.  Likely post-op inflammation. Care management to assist with discharge, up with therapy today. Will remove bulky dressing tomorrow morning. CBC and BMP ordered for tomorrow morning. Begin working on having a BM.  DVT Prophylaxis - Lovenox, Foot Pumps and TED hose Weight-Bearing as tolerated to right leg  J. Horris LatinoLance Teanna Elem, PA-C [facility] 02/09/2017, 7:59 AM

## 2017-02-09 NOTE — Care Management (Signed)
Cancelled home health and Lovenox

## 2017-02-09 NOTE — Progress Notes (Signed)
Notified Dr. Rosita KeaMenz that patient states she is no longer should be taking Flagyl. Ok to discontinue order.

## 2017-02-09 NOTE — Care Management Note (Signed)
Case Management Note  Patient Details  Name: Holly Robbins MRN: [id number] Date of Birth: [birth year]  Subjective/Objective:  POD # 1 right TKA. Met with patient and her spouse at bedside to discuss discharge planning. Patient lives at home with her spouse. Her granddaughter will be coming to stay with her at night.  Offered choice of home health agencies. [facility]. Patient prefers to do her outpatient PT at [facility]. She has a walker. Pharmacy: [facility] [phone]. Called Lovenox 40 mg # 14 no refills. PCP is Dr. Doy Hutching.                  Action/Plan: No DME needs, Lovenox called in, [facility] for HHPT.   Expected Discharge Date:                  Expected Discharge Plan:  [place]  In-House Referral:     Discharge planning Services  CM Consult  Post Acute Care Choice:  Home Health Choice offered to:  Patient  DME Arranged:    DME Agency:     HH Arranged:  PT [place]:  [facility] [employer]  Status of Service:  In process, will continue to follow  If discussed at Long Length of Stay Meetings, dates discussed:    Additional Comments:  Jolly Mango, RN 02/09/2017, 8:37 AM

## 2017-02-09 NOTE — Clinical Social Work Note (Signed)
Clinical Social Work Assessment  Patient Details  Name: Holly Robbins MRN: [id number] Date of Birth: 10/13/1940  Date of referral:  02/09/17               Reason for consult:  Facility Placement                Permission sought to share information with:  Facility Contact Representative Permission granted to share information::  Yes, Verbal Permission Granted  Name::      Skilled Nursing Facility   Agency::   [place]   Relationship::     Contact Information:     Housing/Transportation Living arrangements for the past 2 months:  Single Family Home Source of Information:  Patient, Spouse Patient Interpreter Needed:  None Criminal Activity/Legal Involvement Pertinent to Current Situation/Hospitalization:  No - Comment as needed Significant Relationships:  Adult Children, Spouse Lives with:  Spouse Do you feel safe going back to the place where you live?  Yes Need for family participation in patient care:  Yes (Comment)  Care giving concerns:  Patient lives in [place] with her husband Holly Robbins.    Social Worker assessment / plan:  Clinical Social Worker (CSW) received SNF consult. PT is recommending SNF. CSW met with patient and her husband Holly Robbins was at bedside. CSW introduced self and explained role of CSW department. Patient was alert and oriented X4 and was laying in the bed. Patient refused PT this afternoon and reported that she is in pain. CSW explained the importance of working with PT and making progress. CSW also explained SNF process and that Health Team will have to approve SNF. Patient and husband verbalized their understanding. Patient and husband are agreeable to SNF search and prefer [facility]. FL2 complete and faxed out. Health Team case manager aware of above. CSW will continue to follow and assist as needed.     Employment status:  Retired Insurance information:  Managed Medicare PT Recommendations:  Skilled Nursing Facility Information / Referral to  community resources:  Skilled Nursing Facility  Patient/Family's Response to care:  Patient and her husband are agreeable to SNF search in [place].    Patient/Family's Understanding of and Emotional Response to Diagnosis, Current Treatment, and Prognosis:  Patient and her husband were very pleasant and thanked CSW for visit.   Emotional Assessment Appearance:  Appears stated age Attitude/Demeanor/Rapport:    Affect (typically observed):  Accepting, Adaptable, Pleasant Orientation:  Oriented to Self, Oriented to Place, Oriented to  Time, Oriented to Situation Alcohol / Substance use:  Not Applicable Psych involvement (Current and /or in the community):  No (Comment)  Discharge Needs  Concerns to be addressed:  Discharge Planning Concerns Readmission within the last 30 days:  No Current discharge risk:  Dependent with Mobility Barriers to Discharge:  Continued Medical Work up   Sample, [name], LCSW 02/09/2017, 3:04 PM  

## 2017-02-09 NOTE — Clinical Social Work Placement (Signed)
   CLINICAL SOCIAL WORK PLACEMENT  NOTE  Date:  02/09/2017  Patient Details  Name: Holly Robbins MRN: [id number] Date of Birth: 1941/01/23  Clinical Social Work is seeking post-discharge placement for this patient at the Skilled  Nursing Facility level of care (*CSW will initial, date and re-position this form in  chart as items are completed):  Yes   Patient/family provided with [place] Clinical Social Work Department's list of facilities offering this level of care within the geographic area requested by the patient (or if unable, by the patient's family).  Yes   Patient/family informed of their freedom to choose among providers that offer the needed level of care, that participate in Medicare, Medicaid or managed care program needed by the patient, have an available bed and are willing to accept the patient.  Yes   Patient/family informed of [facility]'s ownership interest in [facility] and [facility], as well as of the fact that they are under no obligation to receive care at these facilities.  PASRR submitted to EDS on 02/08/17     PASRR number received on 02/08/17     Existing PASRR number confirmed on       FL2 transmitted to all facilities in geographic area requested by pt/family on 02/09/17     FL2 transmitted to all facilities within larger geographic area on       Patient informed that his/her managed care company has contracts with or will negotiate with certain facilities, including the following:            Patient/family informed of bed offers received.  Patient chooses bed at       Physician recommends and patient chooses bed at      Patient to be transferred to   on  .  Patient to be transferred to facility by       Patient family notified on   of transfer.  Name of family member notified:        PHYSICIAN       Additional Comment:    _______________________________________________ Avonell Lenig, Darleen CrockerBailey M, LCSW 02/09/2017, 3:03 PM

## 2017-02-09 NOTE — Progress Notes (Signed)
PT Cancellation Note  Patient Details Name: Holly Robbins MRN: [id number] DOB: Nov 30, 1940   Cancelled Treatment:    Reason Eval/Treat Not Completed: Patient declined, no reason specified; Pt refused PM session secondary to R knee pain and fatigue with pt reporting no sleep previous night, nursing aware.  Will attempt to see pt at a future date/time as appropriate.    Ovidio Hanger. Scott Mansoor Hillyard PT, DPT 02/09/17, 4:41 PM

## 2017-02-09 NOTE — Evaluation (Signed)
Occupational Therapy Evaluation Patient Details Name: Holly Robbins MRN: [id number] DOB: 07-Dec-1940 Today's Date: 02/09/2017    History of Present Illness Pt diagnosed with primary OA of R knee and is s/p elective R TKA.   Clinical Impression   Pt seen for OT evaluation this date. Pt is 76 year old female s/p R TKR.  Pt was independent in all ADLs prior to surgery and is eager to return to PLOF.  Pt currently requires minimal assist for LB dressing while in seated position due to pain and limited AROM of R knee.  Pt would benefit from instruction in dressing techniques with or without assistive devices for dressing and bathing skills.  Pt would also benefit from recommendations for home modifications to increase safety in the bathroom and prevent falls. Will assess for OT [facility] needs as pt progresses in therapy.      Follow Up Recommendations  SNF    Equipment Recommendations  3 in 1 bedside commode    Recommendations for Other Services       Precautions / Restrictions Precautions Precautions: Fall Restrictions Weight Bearing Restrictions: No RLE Weight Bearing: Weight bearing as tolerated Other Position/Activity Restrictions: Pt able to perform 10 independent RLE SLRs without extensor lag, no KI required      Mobility Bed Mobility     General bed mobility comments: deferred due to pt reported pain  Transfers         General transfer comment: deferred due to pt reported pain    Balance                              ADL either performed or assessed with clinical judgement   ADL Overall ADL's : Needs assistance/impaired Eating/Feeding: Bed level;Set up   Grooming: Bed level;Set up   Upper Body Bathing: Sitting;Set up;Supervision/ safety   Lower Body Bathing: Set up;Minimal assistance;Sitting/lateral leans;Sit to/from stand   Upper Body Dressing : Set up;Sitting   Lower Body Dressing: Minimal assistance;Sitting/lateral leans;Sit to/from  stand Lower Body Dressing Details (indicate cue type and reason): pt/spouse educated in use of AE for LB dressing tasks, educated in compression stocking mgt with spouse able to trial strategies. Plan for daughter to assist with stockings at home Toilet Transfer: BSC;Stand-pivot;Min guard;RW           Functional mobility during ADLs: Min guard;Rolling walker General ADL Comments: pt generally min assist for LB ADL, min guard for ambulation, limited by pain     Vision Baseline Vision/History: Wears glasses Wears Glasses: At all times Patient Visual Report: No change from baseline Vision Assessment?: No apparent visual deficits     Perception     Praxis      Pertinent Vitals/Pain Pain Assessment: 0-10 Pain Score: 8  Pain Location: 3/10 at rest, increasing to 7-8/10 when having muscle spasms in RLE Pain Descriptors / Indicators: Sharp;Shooting;Spasm Pain Intervention(s): Limited activity within patient's tolerance;Monitored during session;Repositioned;Ice applied;Patient requesting pain meds-RN notified     Hand Dominance Right   Extremity/Trunk Assessment Upper Extremity Assessment Upper Extremity Assessment: Overall WFL for tasks assessed (R shoulder flexion 3+/5, otherwise WFL bilaterally)   Lower Extremity Assessment Lower Extremity Assessment: Defer to PT evaluation;Generalized weakness;RLE deficits/detail       Communication Communication Communication: No difficulties   Cognition Arousal/Alertness: Awake/alert Behavior During Therapy: WFL for tasks assessed/performed Overall Cognitive Status: Within Functional Limits for tasks assessed  General Comments       Exercises Other Exercises Other Exercises: Pt/spouse educated in home/routines modifications to maximize safety and functional independence  Other Exercises: Positioning education/review to encourage R knee ext while at rest in chair or bed   Shoulder  Instructions      Home Living Family/patient expects to be discharged to:: Private residence Living Arrangements: Spouse/significant other Available Help at Discharge: Family;Available 24 hours/day (granddaughter planning to stay with pt for a while, daughter will also be around PRN) Type of Home: House Home Access: Stairs to enter [employer] of Steps: 2 + no rails or 7 with bilateral rails   Home Layout: One level     Bathroom Shower/Tub: Producer, television/film/video: Handicapped height     Home Equipment: Environmental consultant - 2 wheels;Walker - 4 wheels;Cane - quad;Grab bars - toilet;Grab bars - tub/shower;Hand held shower head;Adaptive equipment;Shower Engineering geologist: Reacher        Prior Functioning/Environment Level of Independence: Independent with assistive device(s)        Comments: Pt Mod Ind with amb in home with rollator and without AD in community, no fall history; pt Ind with ADLs, spouse drives        OT Problem List: Pain;Decreased strength;Decreased range of motion;Decreased activity tolerance;Decreased safety awareness;Decreased knowledge of use of DME or AE      OT Treatment/Interventions: Self-care/ADL training;Therapeutic activities;Therapeutic exercise;Energy conservation;DME and/or AE instruction;Patient/family education    OT Goals(Current goals can be found in the care plan section) Acute Rehab OT Goals Patient Stated Goal: have less pain OT Goal Formulation: With patient/family Time For Goal Achievement: 02/23/17 Potential to Achieve Goals: Good  OT Frequency: Min 1X/week   Barriers to D/C: Inaccessible home environment          Co-evaluation              AM-PAC PT "6 Clicks" Daily Activity     Outcome Measure Help from another person eating meals?: None Help from another person taking care of personal grooming?: A Little Help from another person toileting, which includes using toliet, bedpan, or urinal?: A Little Help  from another person bathing (including washing, rinsing, drying)?: A Little Help from another person to put on and taking off regular upper body clothing?: A Little Help from another person to put on and taking off regular lower body clothing?: A Little 6 Click Score: 19   End of Session    Activity Tolerance: Patient limited by pain Patient left: in bed;with call bell/phone within reach;with bed alarm set;with family/visitor present;with SCD's reapplied;Other (comment) (polar care in place)  OT Visit Diagnosis: Other abnormalities of gait and mobility (R26.89);Muscle weakness (generalized) (M62.81);Pain Pain - Right/Left: Right Pain - part of body: Knee                Time: 6962-9528 OT Time Calculation (min): 29 min Charges:  OT General Charges $OT Visit: 1 Procedure OT Evaluation $OT Eval Low Complexity: 1 Procedure OT Treatments $Self Care/Home Management : 8-22 mins G-Codes:     Richrd Prime, MPH, MS, OTR/L ascom [phone] 02/09/17, 2:21 PM

## 2017-02-10 LAB — URINALYSIS, COMPLETE (UACMP) WITH MICROSCOPIC
BILIRUBIN URINE: NEGATIVE
Bacteria, UA: NONE SEEN
GLUCOSE, UA: NEGATIVE mg/dL
KETONES UR: NEGATIVE mg/dL
Nitrite: NEGATIVE
PROTEIN: NEGATIVE mg/dL
Specific Gravity, Urine: 1.01 (ref 1.005–1.030)
pH: 7 (ref 5.0–8.0)

## 2017-02-10 LAB — BASIC METABOLIC PANEL
ANION GAP: 9 (ref 5–15)
BUN: 11 mg/dL (ref 6–20)
CHLORIDE: 99 mmol/L — AB (ref 101–111)
CO2: 29 mmol/L (ref 22–32)
Calcium: 8.9 mg/dL (ref 8.9–10.3)
Creatinine, Ser: 0.64 mg/dL (ref 0.44–1.00)
GFR calc non Af Amer: >60 mL/min (ref >60)
Glucose, Bld: 116 mg/dL — ABNORMAL HIGH (ref 65–99)
Potassium: 3.5 mmol/L (ref 3.5–5.1)
Sodium: 137 mmol/L (ref 135–145)

## 2017-02-10 LAB — CBC
HEMATOCRIT: 33.4 % — AB (ref 35.0–47.0)
HEMOGLOBIN: 11.7 g/dL — AB (ref 12.0–16.0)
MCH: 31.9 pg (ref 26.0–34.0)
MCHC: 35.2 g/dL (ref 32.0–36.0)
MCV: 90.8 fL (ref 80.0–100.0)
Platelets: 268 10*3/uL (ref 150–440)
RBC: 3.68 MIL/uL — ABNORMAL LOW (ref 3.80–5.20)
RDW: 14.3 % (ref 11.5–14.5)
WBC: 11.1 10*3/uL — AB (ref 3.6–11.0)

## 2017-02-10 MED ORDER — FLEET ENEMA 7-19 GM/118ML RE ENEM: 1.0000 | ENEMA | Freq: Every day | RECTAL | Status: DC | PRN

## 2017-02-10 NOTE — Progress Notes (Signed)
OT Cancellation Note  Patient Details Name: Holly Robbins MRN: [id number] DOB: 05/26/1941   Cancelled Treatment:    Reason Eval/Treat Not Completed: Patient declined, no reason specified. Family in room with pt, pt dozing. Pt politely declining OT treatment session at this time due to fatigue stating, "you know, I haven't been able to get much sleep at all in a couple days." As family member and pt started asking OT questions about STR process, a representative from [employer] entered room. Will re-attempt OT treatment session next date.   Richrd PrimeJamie Stiller, MPH, MS, OTR/L ascom [phone] 02/10/17, 3:45 PM

## 2017-02-10 NOTE — Discharge Instructions (Signed)

## 2017-02-10 NOTE — Progress Notes (Signed)
Clinical Social Worker (CSW) presented bed offers to patient and her husband Baldo AshCarl this morning. Patient was sitting up in the chair at bedside and appeared to be in a good mood. [facility] liaison is aware of accepted bed offer. CSW contacted Health Team case manager and made her aware of above. CSW will continue to follow and assist as needed.   Baker Hughes IncorporatedBailey Trinidad Ingle, LCSW [phone]

## 2017-02-10 NOTE — Progress Notes (Signed)
Pt assisted up to the recliner to sit this AM. 2+ min to mod assist to transfer. Pt worked with PT today. Foley d/ced at 1400 per MD order after obtaining U/A C and S. Pt given milk of mag per order. Pt pain tolerable today. Vital signs stable.

## 2017-02-10 NOTE — Discharge Summary (Signed)
Physician Discharge Summary  Patient ID: Holly Robbins MRN: [id number] DOB/AGE: [birth year] 76 y.o.  Admit date: 02/08/2017 Discharge date: 02/11/2017  Admission Diagnoses:  PRIMARY OSTEOARTHRITIS OF RIGHT KNEE  Discharge Diagnoses: Patient Active Problem List   Diagnosis Date Noted  . Primary osteoarthritis of right knee 02/08/2017  . Arthritis 09/28/2016  . GERD (gastroesophageal reflux disease) 09/28/2016  . History of hematuria 09/28/2016  . Hyperlipidemia, unspecified 09/28/2016  . Hypertension 09/28/2016  . IBS (irritable bowel syndrome) 09/28/2016  . Psoriasis 09/28/2016  . DDD (degenerative disc disease), thoracolumbar 04/18/2014  Primary osteoarthritis of right knee  Past Medical History:  Diagnosis Date  . Arthritis   . GERD (gastroesophageal reflux disease)   . History of hiatal hernia   . Hypertension   . Spinal headache 1956   block for delivery of son   Transfusion: None.   Consultants (if any):   Discharged Condition: Improved  Hospital Course: Holly Robbins is an 76 y.o. female who was admitted 02/08/2017 with a diagnosis of primary osteoarthritis of right knee and went to the operating room on 02/08/2017 and underwent the above named procedures.    Surgeries: Procedure(s): TOTAL KNEE ARTHROPLASTY on 02/08/2017 Patient tolerated the surgery well. Taken to PACU where she was stabilized and then transferred to the orthopedic floor.  Started on Lovenox 40mg  q 24 hrs. Foot pumps applied bilaterally at 80 mm. Heels elevated on bed with rolled towels. No evidence of DVT. Negative Homan. Physical therapy started on day #1 for gait training and transfer. OT started day #1 for ADL and assisted devices.  Patient's IV and foley were removed on POD1 however patient suffered urinary retention and had to undergo in-and-out foleys on POD1.  Foley removed on POD2, UA negative for significant UTI.  Implants: Medacta GMK sphere right 3+ femur 3 tibia with short  stem 10 mm insert and 2 patella all components cemented  She was given perioperative antibiotics:  Anti-infectives    Start     Dose/Rate Route Frequency Ordered Stop   02/08/17 1115  metroNIDAZOLE (FLAGYL) tablet 500 mg  Status:  Discontinued     500 mg Oral 3 times daily 02/08/17 1105 02/09/17 1213   02/08/17 1115  clindamycin (CLEOCIN) IVPB 900 mg     900 mg 100 mL/hr over 30 Minutes Intravenous Every 6 hours 02/08/17 1105 02/08/17 2329   02/08/17 0555  clindamycin (CLEOCIN) 900 MG/50ML IVPB    Comments:  Slemenda, Debra   : cabinet override      02/08/17 0555 02/08/17 0744   02/07/17 2145  clindamycin (CLEOCIN) IVPB 900 mg     900 mg 100 mL/hr over 30 Minutes Intravenous  Once 02/07/17 2139 02/08/17 0744    .  She was given sequential compression devices, early ambulation, and Lovenox for DVT prophylaxis.  She benefited maximally from the hospital stay and there were no complications.    Recent vital signs:  Vitals:   02/10/17 2312 02/11/17 0154  BP: (!) 97/47 (!) 114/50  Pulse: (!) 101 86  Resp: 18   Temp: 99.5 F (37.5 C)     Recent laboratory studies:  Lab Results  Component Value Date   HGB 11.2 (L) 02/11/2017   HGB 11.7 (L) 02/10/2017   HGB 12.0 02/09/2017   Lab Results  Component Value Date   WBC 12.6 (H) 02/11/2017   PLT 260 02/11/2017   Lab Results  Component Value Date   INR 1.06 02/02/2017   Lab Results  Component Value  Date   NA 134 (L) 02/11/2017   K 4.1 02/11/2017   CL 95 (L) 02/11/2017   CO2 31 02/11/2017   BUN 15 02/11/2017   CREATININE 0.73 02/11/2017   GLUCOSE 125 (H) 02/11/2017    Discharge Medications:   Allergies as of 02/11/2017      Reactions   Adhesive [tape] Other (See Comments)   Blistering (PAPER TAPE IS OKAY)   Atorvastatin    Other reaction(s): Muscle Pain   Contrast Media [iodinated Diagnostic Agents] Hives   Eggs Or Egg-derived Products Nausea Only   Severe abdominal pain   Erythromycin Hives   Latex Other (See  Comments)   Blisters on skin   Penicillins Hives   Has patient had a PCN reaction causing immediate rash, facial/tongue/throat swelling, SOB or lightheadedness with hypotension:No Has patient had a PCN reaction causing severe rash involving mucus membranes or skin necrosis:Yes Has patient had a PCN reaction that required hospitalization:No Has patient had a PCN reaction occurring within the last 10 years:No If all of the above answers are "NO", then may proceed with Cephalosporin use.      Medication List    TAKE these medications   aspirin EC 81 MG tablet Take 81 mg by mouth daily.   B-complex with vitamin C tablet Take 1 tablet by mouth daily.   BIOTIN PO Take 1 tablet by mouth daily.   CALCIUM 600 600 MG Tabs tablet Generic drug:  calcium carbonate Take 600 mg by mouth 2 (two) times daily.   CRANBERRY CONCENTRATE PO Take 25,000 mg by mouth every morning.   diazepam 5 MG tablet Commonly known as:  VALIUM Take 5 mg by mouth 2 (two) times daily as needed. For anxiety/sleep.   diclofenac 75 MG EC tablet Commonly known as:  VOLTAREN Take 75 mg by mouth 2 (two) times daily as needed (for pain (scheduled in the morning)).   dicyclomine 20 MG tablet Commonly known as:  BENTYL Take 20 mg by mouth See admin instructions. Take 1 tablet (20 mg) by mouth scheduled every morning, may repeat dose if needed for IBS   enoxaparin 30 MG/0.3ML injection Commonly known as:  LOVENOX Inject 0.3 mLs (30 mg total) into the skin every 12 (twelve) hours.   FISH OIL PO Take 1 capsule by mouth at bedtime.   GLUCOSAMINE CHONDR 1500 COMPLX PO Take 1 tablet by mouth every morning.   L-Lysine 500 MG Caps Take 1 capsule by mouth every morning.   lisinopril-hydrochlorothiazide 20-12.5 MG tablet Commonly known as:  PRINZIDE,ZESTORETIC Take 1 tablet by mouth daily.   MAGNESIUM PO Take 1 tablet by mouth daily.   metroNIDAZOLE 500 MG tablet Commonly known as:  FLAGYL Take 500 mg by mouth 3  (three) times daily.   multivitamin with minerals Tabs tablet Take 1 tablet by mouth daily.   oxyCODONE 5 MG immediate release tablet Commonly known as:  Oxy IR/ROXICODONE Take 1-2 tablets (5-10 mg total) by mouth every 4 (four) hours as needed for breakthrough pain.   pravastatin 20 MG tablet Commonly known as:  PRAVACHOL Take 20 mg by mouth daily.   PROBIOTIC COLON SUPPORT PO Take 1 capsule by mouth every morning.   psyllium 58.6 % powder Commonly known as:  METAMUCIL Take 1 packet by mouth at bedtime. 2 TEASPOONSFULS IN A GLASS OF WATER   pyridOXINE 100 MG tablet Commonly known as:  VITAMIN B-6 Take 100 mg by mouth daily.   ranitidine 300 MG capsule Commonly known as:  ZANTAC  Take 300 mg by mouth daily at 8 pm.   Vitamin B-12 CR 1500 MCG Tbcr Take 1,500 mcg by mouth daily.   Vitamin D3 5000 units Caps Take 1 capsule by mouth every morning.   vitamin E 1000 UNIT capsule Take 1,000 Units by mouth daily.            Durable Medical Equipment        Start     Ordered   02/08/17 1105  DME Walker rolling  Once    Question:  Patient needs a walker to treat with the following condition  Answer:  Status post total knee replacement using cement, right   02/08/17 1105   02/08/17 1105  DME 3 n 1  Once     02/08/17 1105   02/08/17 1105  DME Bedside commode  Once    Question:  Patient needs a bedside commode to treat with the following condition  Answer:  Status post total knee replacement using cement, right   02/08/17 1105      Diagnostic Studies: Dg Knee 1-2 Views Right  Result Date: 02/08/2017 CLINICAL DATA:  Total knee replacement EXAM: RIGHT KNEE - 1-2 VIEW COMPARISON:  None. FINDINGS: Two views study shows patient be status post tricompartmental knee replacement. No evidence for immediate hardware complications. Gas in the soft tissues compatible with the immediate postoperative state. IMPRESSION: Status post tricompartmental knee replacement. Electronically  Signed   By: Kennith Center M.D.   On: 02/08/2017 10:27   Disposition: Plan is for discharge either to SNF on 02/11/17.  If urinary Cx returns demonstrating UTI will send in anti-biotics.   Contact information for follow-up providers    Kennedy Bucker, MD Follow up in 14 day(s).   Specialty:  Orthopedic Surgery Why:  Staple Removal. Contact information: [address] [place] [address] [phone]            Contact information for after-discharge care    Destination    [facility] Follow up.   Specialty:  Skilled Nursing Facility Contact information: [address] [place] [address] [phone]                 Signed: Meriel Pica PA-C 02/11/2017, 7:41 AM

## 2017-02-10 NOTE — Progress Notes (Signed)
Subjective: 2 Days Post-Op Procedure(s) (LRB): TOTAL KNEE ARTHROPLASTY (Right) Patient reports pain as moderate.   Patient is well, but has had some minor complaints of urinary retention requiring repeat cath. Care management to assist with discharge planning, currently recommending SNF placement. Negative for chest pain and shortness of breath Fever: Temp 99.8 last night. Gastrointestinal:Negative for nausea and vomiting  Objective: Vital signs in last 24 hours: Temp:  [99.8 F (37.7 C)-100.3 F (37.9 C)] 99.8 F (37.7 C) (07/19 0120) Pulse Rate:  [92] 92 (07/19 0020) BP: (146)/(55) 146/55 (07/19 0020) SpO2:  [95 %] 95 % (07/19 0020)  Intake/Output from previous day:  Intake/Output Summary (Last 24 hours) at 02/10/17 0729 Last data filed at 02/09/17 2345  Gross per 24 hour  Intake              480 ml  Output             1150 ml  Net             -670 ml    Intake/Output this shift: No intake/output data recorded.  Labs:  Recent Labs  02/08/17 1143 02/09/17 0525 02/10/17 0515  HGB 13.2 12.0 11.7*    Recent Labs  02/09/17 0525 02/10/17 0515  WBC 14.5* 11.1*  RBC 3.79* 3.68*  HCT 34.5* 33.4*  PLT 311 268    Recent Labs  02/09/17 0525 02/10/17 0515  NA 139 137  K 4.3 3.5  CL 103 99*  CO2 28 29  BUN 14 11  CREATININE 0.68 0.64  GLUCOSE 108* 116*  CALCIUM 9.0 8.9   No results for input(s): LABPT, INR in the last 72 hours.   EXAM General - Patient is Alert, Appropriate and Oriented Extremity - Neurovascular intact Sensation intact distally Dorsiflexion/Plantar flexion intact Incision: dressing C/D/I No cellulitis present Dressing/Incision - Bulky dressing removed today, mild effusion to the right knee, no active drainage or erythema. Motor Function - intact, moving foot and toes well on exam.   Abdomen soft on exam, normal BS without tympany.  Past Medical History:  Diagnosis Date  . Arthritis   . GERD (gastroesophageal reflux disease)   .  History of hiatal hernia   . Hypertension   . Spinal headache 1956   block for delivery of son    Assessment/Plan: 2 Days Post-Op Procedure(s) (LRB): TOTAL KNEE ARTHROPLASTY (Right) Active Problems:   Primary osteoarthritis of right knee  Estimated body mass index is 30.11 kg/m as calculated from the following:   Height as of this encounter: 5\' 3"  (1.6 m).   Weight as of this encounter: 77.1 kg (170 lb). Advance diet Up with therapy  Labs reviewed, WBC 11.1, improved since yesterday. Urinary Cath placed for urinary retention, will continue to monitor. Plan will be for discharge to SNF at this point, will continue to monitor today. Bulky dressing changed to honeycomb dressing today. CBC and BMP ordered for tomorrow morning. Begin working on having a BM.  DVT Prophylaxis - Lovenox, Foot Pumps and TED hose Weight-Bearing as tolerated to right leg  J. Horris LatinoLance Giavonni Cizek, PA-C [facility] 02/10/2017, 7:29 AM

## 2017-02-10 NOTE — Progress Notes (Signed)
Physical Therapy Treatment Patient Details Name: Holly Robbins MRN: [id number] DOB: 27-Apr-1941 Today's Date: 02/10/2017    History of Present Illness Pt diagnosed with primary OA of R knee and is s/p elective R TKA.    PT Comments    [facility]. Second attempt, pt had just returned to bed [facility] Pt wished to remain in bed. Pt requesting medication and reports R knee pain to be 8/10; nursing administered pain medication during session. Pt also has complaints of lower abdomen pain and concerned with possible UTI (no other symptoms present) nursing aware. Pt participates in Right lower extremity range, stretching and strengthening exercises in supine. Limited range right knee 6-64 degrees in supine. Encouraged quad set work and plan to see pt this afternoon for continued progress of range, strength and endurance to improve function. Will maintain current discharge plan recommendations at this time, but may need to consider skilled nursing facility if pt does not progress ambulation and demonstrate stair climbing.   Follow Up Recommendations  Home health PT;Other (comment) (Pt will need to demonstrate improved amb/steps or needs SNF)     Equipment Recommendations  None recommended by PT    Recommendations for Other Services       Precautions / Restrictions Precautions Precautions: Fall;Knee Restrictions Weight Bearing Restrictions: Yes RLE Weight Bearing: Weight bearing as tolerated    Mobility  Bed Mobility               General bed mobility comments: Not tested; pt had just returned to bed from recliner and BSC use. Requested to remain in bed this session  Transfers                    Ambulation/Gait                 Stairs            Wheelchair Mobility    Modified Rankin (Stroke Patients Only)       Balance                                            Cognition Arousal/Alertness: Awake/alert Behavior During Therapy: WFL for tasks assessed/performed Overall Cognitive Status: Within Functional Limits for tasks assessed                                        Exercises Total Joint Exercises Ankle Circles/Pumps: AROM;Both;20 reps;Supine Quad Sets: Strengthening;Both;20 reps;Supine (gravity assisted stretch on R) Short Arc Quad: AAROM;Right;20 reps;Supine Heel Slides: AAROM;Right;10 reps;Supine;Other (comment) (10 second hold each rep for stretch) Straight Leg Raises: AAROM;Right;10 reps;Supine Knee Flexion: Right;Supine (stretch) Goniometric ROM: 6-64 (in supine) Other Exercises Other Exercises: Education on breathing with exercise, as pt tends to hold breath (counting aloud to avoid)    General Comments        Pertinent Vitals/Pain Pain Assessment: 0-10 Pain Score: 8  (Also notes lower abdomen pain) Pain Location: R knee Pain Descriptors / Indicators: Aching;Constant;Sharp;Operative site guarding Pain Intervention(s): Patient requesting pain meds-RN notified;RN gave pain meds during session;Ice applied;Limited activity within patient's tolerance    Home Living  Prior Function            PT Goals (current goals can now be found in the care plan section) Progress towards PT goals: Not progressing toward goals - comment    Frequency    BID      PT Plan Current plan remains appropriate    Co-evaluation              AM-PAC PT "6 Clicks" Daily Activity  Outcome Measure  Difficulty turning over in bed (including adjusting bedclothes, sheets and blankets)?: A Little Difficulty moving from lying on back to sitting on the side of the bed? : Total Difficulty sitting down on and standing up from a chair with arms (e.g., wheelchair, bedside commode, etc,.)?: Total Help needed moving to and from a bed to chair (including a wheelchair)?: A Little Help needed  walking in hospital room?: A Lot Help needed climbing 3-5 steps with a railing? : A Lot 6 Click Score: 12    End of Session   Activity Tolerance: Patient limited by pain Patient left: in bed;with call bell/phone within reach;with bed alarm set;with SCD's reapplied;Other (comment);with family/visitor present (polar care in place) Nurse Communication: Patient requests pain meds PT Visit Diagnosis: Other abnormalities of gait and mobility (R26.89);Muscle weakness (generalized) (M62.81)     Time: 1610-9604 PT Time Calculation (min) (ACUTE ONLY): 23 min  Charges:  $Therapeutic Exercise: 23-37 mins                    G Codes:        Scot Dock, PTA 02/10/2017, 11:13 AM

## 2017-02-10 NOTE — Progress Notes (Signed)
Physical Therapy Treatment Patient Details Name: Holly Robbins MRN: [id number] DOB: 08/20/1940 Today's Date: 02/10/2017    History of Present Illness Pt diagnosed with primary OA of R knee and is s/p elective R TKA.    PT Comments    Pt agreeable to PT; pain somewhat decreased to 5/10 in Right lower extremity. Pt notes she is having difficulty tolerating pain. Pt requires mod A for bed mobility in/out of bed. Once seated edge of bed, patient manages with supervision and participates in seated exercise. Transfer to/from stand with Mod A and cues for safe hand placement and well as increased use of Right lower extremity. Pt works on stand tolerance with wt shift to the R and also QS in stand. Improved range of motion tested in seated position this session. Pt was unable to take any steps this afternoon. Continue PT to progress range, strength, endurance to improve all functional mobility. Discharge recommendation changed to skilled nursing facility. SW/CM aware and currently working on plan.    Follow Up Recommendations  Home health PT;Other (comment) (Pt will need to demonstrate improved amb/steps or needs SNF)     Equipment Recommendations  None recommended by PT    Recommendations for Other Services       Precautions / Restrictions Precautions Precautions: Fall;Knee Restrictions Weight Bearing Restrictions: Yes RLE Weight Bearing: Weight bearing as tolerated    Mobility  Bed Mobility Overal bed mobility: Needs Assistance Bed Mobility: Supine to Sit;Sit to Supine     Supine to sit: Mod assist;HOB elevated Sit to supine: Mod assist   General bed mobility comments: to sit assist for holding RLE and trunk; assist primarily for LEs to supine. pt able to reposition upward in bed with use of rails and min A  Transfers Overall transfer level: Needs assistance Equipment used: Rolling walker (2 wheeled) Transfers: Sit to/from Stand Sit to Stand: Mod assist         General  transfer comment: 2x Cues for hands and assist to elevate to upright posture. Cues to use BLE equally; increased time  Ambulation/Gait             General Gait Details: unable to take steps   Stairs            Wheelchair Mobility    Modified Rankin (Stroke Patients Only)       Balance Overall balance assessment: Needs assistance Sitting-balance support: Bilateral upper extremity supported;Feet supported Sitting balance-Leahy Scale: Good     Standing balance support: Bilateral upper extremity supported Standing balance-Leahy Scale: Fair Standing balance comment: Decreased weight acceptance to RLE; improves with standing QS work. Able to stand with min guard                            Cognition Arousal/Alertness: Awake/alert Behavior During Therapy: WFL for tasks assessed/performed Overall Cognitive Status: Within Functional Limits for tasks assessed                                        Exercises Total Joint Exercises Ankle Circles/Pumps: AROM;Both;20 reps;Supine Quad Sets: Strengthening;Both;20 reps;Supine (gravity assisted stretch on R) Long Arc Quad: AROM;Right;10 reps;Seated (2 sets) Knee Flexion: Right;10 reps;Seated;AROM;AAROM (10 sec hold ea position, several ea rep. A with LLE) Goniometric ROM: 5-95 (seated) Marching in Standing: Strengthening;Right;10 reps;Standing (2 sets) Other Exercises Other Exercises: stand tolerance with weight  shifting    General Comments        Pertinent Vitals/Pain Pain Assessment: 0-10 Pain Score: 5  Pain Location: R knee Pain Descriptors / Indicators: Aching;Constant;Sharp;Operative site guarding Pain Intervention(s): Monitored during session;Ice applied    Home Living                      Prior Function            PT Goals (current goals can now be found in the care plan section) Progress towards PT goals: Progressing toward goals    Frequency    BID      PT  Plan Current plan remains appropriate    Co-evaluation              AM-PAC PT "6 Clicks" Daily Activity  Outcome Measure  Difficulty turning over in bed (including adjusting bedclothes, sheets and blankets)?: A Little Difficulty moving from lying on back to sitting on the side of the bed? : Total Difficulty sitting down on and standing up from a chair with arms (e.g., wheelchair, bedside commode, etc,.)?: Total Help needed moving to and from a bed to chair (including a wheelchair)?: A Little Help needed walking in hospital room?: A Lot Help needed climbing 3-5 steps with a railing? : A Lot 6 Click Score: 12    End of Session   Activity Tolerance: Patient limited by pain Patient left: in bed;with call bell/phone within reach;with bed alarm set;with SCD's reapplied;Other (comment);with family/visitor present (polar care in place) Nurse Communication: Patient requests pain meds PT Visit Diagnosis: Other abnormalities of gait and mobility (R26.89);Muscle weakness (generalized) (M62.81)     Time: 1321-1400 PT Time Calculation (min) (ACUTE ONLY): 39 min  Charges:  $Therapeutic Exercise: 23-37 mins $Therapeutic Activity: 8-22 mins                    G Codes:        Scot Dock, PTA 02/10/2017, 3:56 PM

## 2017-02-11 DIAGNOSIS — Z5189 Encounter for other specified aftercare: Secondary | ICD-10-CM | POA: Diagnosis not present

## 2017-02-11 DIAGNOSIS — N390 Urinary tract infection, site not specified: Secondary | ICD-10-CM | POA: Diagnosis not present

## 2017-02-11 DIAGNOSIS — I10 Essential (primary) hypertension: Secondary | ICD-10-CM | POA: Diagnosis not present

## 2017-02-11 DIAGNOSIS — E785 Hyperlipidemia, unspecified: Secondary | ICD-10-CM | POA: Diagnosis not present

## 2017-02-11 DIAGNOSIS — R2689 Other abnormalities of gait and mobility: Secondary | ICD-10-CM | POA: Diagnosis not present

## 2017-02-11 DIAGNOSIS — D649 Anemia, unspecified: Secondary | ICD-10-CM | POA: Diagnosis not present

## 2017-02-11 DIAGNOSIS — K219 Gastro-esophageal reflux disease without esophagitis: Secondary | ICD-10-CM | POA: Diagnosis not present

## 2017-02-11 DIAGNOSIS — K589 Irritable bowel syndrome without diarrhea: Secondary | ICD-10-CM | POA: Diagnosis not present

## 2017-02-11 DIAGNOSIS — M25569 Pain in unspecified knee: Secondary | ICD-10-CM | POA: Diagnosis not present

## 2017-02-11 DIAGNOSIS — Z96651 Presence of right artificial knee joint: Secondary | ICD-10-CM | POA: Diagnosis not present

## 2017-02-11 DIAGNOSIS — Z471 Aftercare following joint replacement surgery: Secondary | ICD-10-CM | POA: Diagnosis not present

## 2017-02-11 DIAGNOSIS — F419 Anxiety disorder, unspecified: Secondary | ICD-10-CM | POA: Diagnosis not present

## 2017-02-11 DIAGNOSIS — R262 Difficulty in walking, not elsewhere classified: Secondary | ICD-10-CM | POA: Diagnosis not present

## 2017-02-11 DIAGNOSIS — M6281 Muscle weakness (generalized): Secondary | ICD-10-CM | POA: Diagnosis not present

## 2017-02-11 DIAGNOSIS — Z7401 Bed confinement status: Secondary | ICD-10-CM | POA: Diagnosis not present

## 2017-02-11 LAB — BASIC METABOLIC PANEL
Anion gap: 8 (ref 5–15)
BUN: 15 mg/dL (ref 6–20)
CALCIUM: 8.9 mg/dL (ref 8.9–10.3)
CO2: 31 mmol/L (ref 22–32)
CREATININE: 0.73 mg/dL (ref 0.44–1.00)
Chloride: 95 mmol/L — ABNORMAL LOW (ref 101–111)
GFR calc non Af Amer: >60 mL/min (ref >60)
GLUCOSE: 125 mg/dL — AB (ref 65–99)
Potassium: 4.1 mmol/L (ref 3.5–5.1)
Sodium: 134 mmol/L — ABNORMAL LOW (ref 135–145)

## 2017-02-11 LAB — URINE CULTURE: Culture: NO GROWTH

## 2017-02-11 LAB — CBC
HEMATOCRIT: 31.9 % — AB (ref 35.0–47.0)
Hemoglobin: 11.2 g/dL — ABNORMAL LOW (ref 12.0–16.0)
MCH: 31.7 pg (ref 26.0–34.0)
MCHC: 35 g/dL (ref 32.0–36.0)
MCV: 90.5 fL (ref 80.0–100.0)
Platelets: 260 10*3/uL (ref 150–440)
RBC: 3.52 MIL/uL — ABNORMAL LOW (ref 3.80–5.20)
RDW: 14.3 % (ref 11.5–14.5)
WBC: 12.6 10*3/uL — ABNORMAL HIGH (ref 3.6–11.0)

## 2017-02-11 MED ORDER — ENOXAPARIN SODIUM 30 MG/0.3ML ~~LOC~~ SOLN: 30.0000 mg | Freq: Two times a day (BID) | SUBCUTANEOUS | 0 refills | Status: AC

## 2017-02-11 MED ORDER — OXYCODONE HCL 5 MG PO TABS: 5.0000 mg | ORAL_TABLET | ORAL | 0 refills | Status: DC | PRN

## 2017-02-11 NOTE — Care Management Important Message (Signed)
Important Message  Patient Details  Name: Holly Robbins MRN: [id number] Date of Birth: 02-09-1941   Medicare Important Message Given:  Yes    Marily MemosLisa M Ansleigh Safer, RN 02/11/2017, 8:18 AM

## 2017-02-11 NOTE — Progress Notes (Signed)
Pt had a bowel movement, bathed and made comfortable. Pt refusing bone foam this time.

## 2017-02-11 NOTE — Progress Notes (Signed)
Physical Therapy Treatment Patient Details Name: Holly Robbins MRN: [id number] DOB: [birth year] Today's Date: 02/11/2017    History of Present Illness Pt diagnosed with primary OA of R knee and is s/p elective R TKA.    PT Comments    Pt presents with significant deficits in functional strength, transfers, mobility, gait, R knee ROM, balance, and activity tolerance.  Pt required Min A with all bed mobility tasks and Min A with sit to/from stand from elevated EOB.  Pt remains very limited with gait and was only able to take several small steps with RW and CGA with significant verbal and tactile cues for proper hand placement and for upright posture.  Pt relies heavily on BUE support on RW with WB activities and is very limited in ability to lift LLE from the floor.  Pt will benefit from PT services in a SNF setting to safely address above deficits for decreased caregiver assistance and eventual return to prior living situation.     Follow Up Recommendations  SNF     Equipment Recommendations  None recommended by PT    Recommendations for Other Services       Precautions / Restrictions Precautions Precautions: Fall;Knee Restrictions Weight Bearing Restrictions: Yes RLE Weight Bearing: Weight bearing as tolerated Other Position/Activity Restrictions: Pt able to perform 10 independent RLE SLRs without extensor lag, no KI required    Mobility  Bed Mobility Overal bed mobility: Needs Assistance Bed Mobility: Supine to Sit;Sit to Supine     Supine to sit: Min assist Sit to supine: Min assist   General bed mobility comments: Min A for RLE in/out of bed with mod encouragement for increased pt participation  Transfers Overall transfer level: Needs assistance Equipment used: Rolling walker (2 wheeled) Transfers: Sit to/from Stand Sit to Stand: Min assist         General transfer comment: Mod verbal cues for sequencing and to increase use of RLE during  transfers  Ambulation/Gait Ambulation/Gait assistance: Min guard Ambulation Distance (Feet): 4 Feet Assistive device: Rolling walker (2 wheeled) Gait Pattern/deviations: Step-to pattern;Trunk flexed;Decreased step length - left;Antalgic   Gait velocity interpretation: Below normal speed for age/gender General Gait Details: Max verbal and tactile cues for proper hand placement on RW and for upright posture during amb.  Pt presented with heavy use of BUEs during amb with poor ability to lift LLE from floor.   Stairs            Wheelchair Mobility    Modified Rankin (Stroke Patients Only)       Balance Overall balance assessment: Needs assistance Sitting-balance support: Bilateral upper extremity supported;Feet supported Sitting balance-Leahy Scale: Good     Standing balance support: Bilateral upper extremity supported Standing balance-Leahy Scale: Fair Standing balance comment: Decreased weight acceptance to RLE, heavy use of BUEs on RW in standing                            Cognition Arousal/Alertness: Awake/alert Behavior During Therapy: WFL for tasks assessed/performed Overall Cognitive Status: Within Functional Limits for tasks assessed                                        Exercises Total Joint Exercises Ankle Circles/Pumps: AROM;Both;10 reps Quad Sets: AROM;Right;10 reps;15 reps Gluteal Sets: Strengthening;10 reps Heel Slides: AAROM;Right;5 reps;10 reps Hip ABduction/ADduction: AAROM;Right;10 reps Straight  Leg Raises: AROM;Right;10 reps Long Arc Quad: AROM;Right;Other (comment) (3 x 10 reps) Knee Flexion: AROM;Right;Other (comment) (3 x 10 reps) Other Exercises Other Exercises: Standing tolerance with weight shifting L/R    General Comments        Pertinent Vitals/Pain Pain Assessment: No/denies pain Pain Location: No pain at rest this session    Home Living                      Prior Function             PT Goals (current goals can now be found in the care plan section) Progress towards PT goals: Not progressing toward goals - comment (Pt remains very limited in all functional mobility tasks by R knee pain)    Frequency    BID      PT Plan Current plan remains appropriate    Co-evaluation              AM-PAC PT "6 Clicks" Daily Activity  Outcome Measure  Difficulty turning over in bed (including adjusting bedclothes, sheets and blankets)?: Total Difficulty moving from lying on back to sitting on the side of the bed? : Total Difficulty sitting down on and standing up from a chair with arms (e.g., wheelchair, bedside commode, etc,.)?: Total Help needed moving to and from a bed to chair (including a wheelchair)?: A Lot Help needed walking in hospital room?: Total Help needed climbing 3-5 steps with a railing? : Total 6 Click Score: 7    End of Session Equipment Utilized During Treatment: Gait belt Activity Tolerance: Patient limited by fatigue;Patient limited by pain Patient left: with family/visitor present;with call bell/phone within reach;Other (comment) (Pt left on [facility] with family present and call bell in reach) Nurse Communication: Mobility status       Time: 1610-9604 PT Time Calculation (min) (ACUTE ONLY): 24 min  Charges:  $Therapeutic Exercise: 8-22 mins $Therapeutic Activity: 8-22 mins                    G Codes:       DElly Modena PT, DPT 02/11/17, 5:10 PM

## 2017-02-11 NOTE — Discharge Planning (Addendum)
[facility] called and stated that patient's room is now ready and ok to call EMS. EMS contacted. Waiting on arrival. IV removed.

## 2017-02-11 NOTE — Progress Notes (Signed)
Physical Therapy Treatment Patient Details Name: Holly Robbins MRN: [id number] DOB: 08/25/1940 Today's Date: 02/11/2017    History of Present Illness Pt diagnosed with primary OA of R knee and is s/p elective R TKA.    PT Comments    Pt presents with deficits in strength, transfers, mobility, gait, balance, R knee ROM and activity tolerance.  Pt continues to require heavy encouragement to perform functional mobility tasks most notably ambulation secondary to R knee pain.  Pt required min A with bed mobility tasks and transfers with verbal cues for sequencing.  Pt very limited with amb and was only able to take several very small steps with RW forwards/backwards and sideways near EOB and then refused additional amb attempts secondary to R knee pain, nsg aware.  Pt will benefit from PT services in a SNF upon discharge from acute care to address above deficits for decreased caregiver assistance and an eventual safe return to prior living situation.     Follow Up Recommendations  SNF     Equipment Recommendations  None recommended by PT    Recommendations for Other Services       Precautions / Restrictions Precautions Precautions: Fall;Knee Restrictions Weight Bearing Restrictions: Yes RLE Weight Bearing: Weight bearing as tolerated Other Position/Activity Restrictions: Pt able to perform 10 independent RLE SLRs without extensor lag, no KI required    Mobility  Bed Mobility Overal bed mobility: Needs Assistance Bed Mobility: Supine to Sit;Sit to Supine     Supine to sit: Min assist Sit to supine: Min assist   General bed mobility comments: Min A for RLE in/out of bed with mod encouragement for increased pt participation  Transfers Overall transfer level: Needs assistance Equipment used: Rolling walker (2 wheeled) Transfers: Sit to/from Stand Sit to Stand: Min assist         General transfer comment: Mod verbal cues for sequencing and to increase use of RLE during  transfers  Ambulation/Gait Ambulation/Gait assistance: Min guard Ambulation Distance (Feet): 4 Feet Assistive device: Rolling walker (2 wheeled) Gait Pattern/deviations: Step-to pattern;Antalgic;Trunk flexed   Gait velocity interpretation: Below normal speed for age/gender General Gait Details: Very limited ability to step this session with max encouragement provided for 1-2 small steps forwards/backwards and 1-2 small sideways steps.  Pt refused further amb.   Stairs            Wheelchair Mobility    Modified Rankin (Stroke Patients Only)       Balance Overall balance assessment: Needs assistance Sitting-balance support: Bilateral upper extremity supported;Feet supported Sitting balance-Leahy Scale: Good     Standing balance support: Bilateral upper extremity supported Standing balance-Leahy Scale: Fair                              Cognition Arousal/Alertness: Awake/alert Behavior During Therapy: WFL for tasks assessed/performed Overall Cognitive Status: Within Functional Limits for tasks assessed                                        Exercises Total Joint Exercises Ankle Circles/Pumps: AROM;Both;10 reps Quad Sets: AROM;Right;10 reps;15 reps;AAROM Gluteal Sets: Strengthening;Both;10 reps Heel Slides: AAROM;Right;5 reps;10 reps Hip ABduction/ADduction: AAROM;Right;5 reps Straight Leg Raises: AROM;AAROM;10 reps;15 reps;Right Long Arc Quad: AROM;Right;10 reps;15 reps Knee Flexion: AROM;Right;10 reps;15 reps Goniometric ROM: R knee A/AAROM: flex 90/95 deg, ext -8/-5 deg Other Exercises Other  Exercises: Standing tolerance with weight shifting L/R Other Exercises: HEP education/review per handout with emphasis on L knee QS and LAQ    General Comments        Pertinent Vitals/Pain Pain Assessment: 0-10 Pain Score: 7  Pain Location: R knee Pain Descriptors / Indicators: Aching;Operative site guarding;Sore Pain Intervention(s):  Premedicated before session;Monitored during session;Limited activity within patient's tolerance    Home Living                      Prior Function            PT Goals (current goals can now be found in the care plan section) Progress towards PT goals: Not progressing toward goals - comment (Pt remains limited by R knee pain with all functional mobility tasks)    Frequency    BID      PT Plan Current plan remains appropriate    Co-evaluation              AM-PAC PT "6 Clicks" Daily Activity  Outcome Measure  Difficulty turning over in bed (including adjusting bedclothes, sheets and blankets)?: Total Difficulty moving from lying on back to sitting on the side of the bed? : Total Difficulty sitting down on and standing up from a chair with arms (e.g., wheelchair, bedside commode, etc,.)?: Total Help needed moving to and from a bed to chair (including a wheelchair)?: A Lot Help needed walking in hospital room?: Total Help needed climbing 3-5 steps with a railing? : Total 6 Click Score: 7    End of Session Equipment Utilized During Treatment: Gait belt Activity Tolerance: Patient limited by pain Patient left: in bed;with bed alarm set;with family/visitor present;with call bell/phone within reach;with nursing/sitter in room Nurse Communication: Mobility status       Time: [id number] PT Time Calculation (min) (ACUTE ONLY): 29 min  Charges:  $Therapeutic Exercise: 8-22 mins $Therapeutic Activity: 8-22 mins                    G Codes:       Ovidio Hanger. Scott Danile Trier PT, DPT 02/11/17, 12:34 PM

## 2017-02-11 NOTE — Progress Notes (Signed)
Patient is medically stable for D/C to [facility] today. Per Jomarie LongsJoseph [facility] liaison patient can come today to room 807. RN will call report to 800 hall nurse at [phone] and arrange EMS for transport after 2 pm per [facility]'s request. [facility] authorization has been received, auth # [id number]. Clinical Child psychotherapistocial Worker (CSW) sent D/C orders to [facility] via [facility]. Patient is aware of above. Please reconsult if future social work needs arise. CSW signing off.   [employer] Hughes Inc[employer] Gan, LCSW [phone]

## 2017-02-11 NOTE — Clinical Social Work Placement (Signed)
   CLINICAL SOCIAL WORK PLACEMENT  NOTE  Date:  02/11/2017  Patient Details  Name: Holly Robbins MRN: [id number] Date of Birth: May 26, 1941  Clinical Social Work is seeking post-discharge placement for this patient at the Skilled  Nursing Facility level of care (*CSW will initial, date and re-position this form in  chart as items are completed):  Yes   Patient/family provided with [facility] Clinical Social Work Department's list of facilities offering this level of care within the geographic area requested by the patient (or if unable, by the patient's family).  Yes   Patient/family informed of their freedom to choose among providers that offer the needed level of care, that participate in Medicare, Medicaid or managed care program needed by the patient, have an available bed and are willing to accept the patient.  Yes   Patient/family informed of [facility]'s ownership interest in [facility] and [facility], as well as of the fact that they are under no obligation to receive care at these facilities.  PASRR submitted to EDS on 02/08/17     PASRR number received on 02/08/17     Existing PASRR number confirmed on       FL2 transmitted to all facilities in geographic area requested by pt/family on 02/09/17     FL2 transmitted to all facilities within larger geographic area on       Patient informed that his/her managed care company has contracts with or will negotiate with certain facilities, including the following:        Yes   Patient/family informed of bed offers received.  [facility]     Physician recommends and patient chooses bed at      Patient to be transferred to [facility] on 02/11/17.  [facility]      Patient family notified on 02/11/17 of transfer.  Name of family member notified:  Patient's husband Holly Robbins is at bedside and aware of D/C today.       PHYSICIAN       Additional Comment:    _______________________________________________ Marissa Weaver, Darleen CrockerBailey M, LCSW 02/11/2017, 11:05 AM

## 2017-02-11 NOTE — Discharge Planning (Signed)
Patient discharge papers given, signed and placed in facility packet. RN explained and educated on DC expectations.  RN assessment and VS reveal stability for DC to [facility] (RM 807).  Report given to Konrad DoloresKim Hicks, RN. Per report, patient's room is still occupied by another patient - Selena BattenKim agreed to contact Hospital when room is available and EMS can be called.

## 2017-02-11 NOTE — Discharge Planning (Signed)
EMS here to transport patient

## 2017-02-11 NOTE — Progress Notes (Signed)
Subjective: 3 Days Post-Op Procedure(s) (LRB): TOTAL KNEE ARTHROPLASTY (Right) Patient reports pain as mild.   Patient is well, and has had no acute complaints or problems Plan is for discharge to SNF today. Negative for chest pain and shortness of breath Fever: Temp 99.5 last night. Gastrointestinal:Negative for nausea and vomiting  Objective: Vital signs in last 24 hours: Temp:  [98.2 F (36.8 C)-99.5 F (37.5 C)] 99.5 F (37.5 C) (07/19 2312) Pulse Rate:  [86-110] 86 (07/20 0154) Resp:  [16-18] 18 (07/19 2312) BP: (97-132)/(47-59) 114/50 (07/20 0154) SpO2:  [95 %-98 %] 98 % (07/20 0154)  Intake/Output from previous day:  Intake/Output Summary (Last 24 hours) at 02/11/17 0734 Last data filed at 02/11/17 0516  Gross per 24 hour  Intake             1200 ml  Output             2200 ml  Net            -1000 ml    Intake/Output this shift: No intake/output data recorded.  Labs:  Recent Labs  02/08/17 1143 02/09/17 0525 02/10/17 0515 02/11/17 0414  HGB 13.2 12.0 11.7* 11.2*    Recent Labs  02/10/17 0515 02/11/17 0414  WBC 11.1* 12.6*  RBC 3.68* 3.52*  HCT 33.4* 31.9*  PLT 268 260    Recent Labs  02/10/17 0515 02/11/17 0414  NA 137 134*  K 3.5 4.1  CL 99* 95*  CO2 29 31  BUN 11 15  CREATININE 0.64 0.73  GLUCOSE 116* 125*  CALCIUM 8.9 8.9   No results for input(s): LABPT, INR in the last 72 hours.   EXAM General - Patient is Alert, Appropriate and Oriented Extremity - Neurovascular intact Sensation intact distally Dorsiflexion/Plantar flexion intact Incision: scant drainage No cellulitis present Dressing/Incision - Scant bloody drainage today. Motor Function - intact, moving foot and toes well on exam.   Abdomen soft on exam, normal BS without tympany.  Past Medical History:  Diagnosis Date  . Arthritis   . GERD (gastroesophageal reflux disease)   . History of hiatal hernia   . Hypertension   . Spinal headache 1956   block for  delivery of son    Assessment/Plan: 3 Days Post-Op Procedure(s) (LRB): TOTAL KNEE ARTHROPLASTY (Right) Active Problems:   Primary osteoarthritis of right knee  Estimated body mass index is 30.11 kg/m as calculated from the following:   Height as of this encounter: 5\' 3"  (1.6 m).   Weight as of this encounter: 77.1 kg (170 lb). Advance diet Up with therapy  Labs reviewed, WBC 12.6,  UA did not demonstrate significant findings for a UTI, awaiting culture results. Urinary Cath removed yesterday. Plan will be for discharge to SNF this afternoon. Patient has had a BM.  DVT Prophylaxis - Lovenox, Foot Pumps and TED hose Weight-Bearing as tolerated to right leg  J. Horris LatinoLance Aziah Kaiser, PA-C [facility] 02/11/2017, 7:34 AM

## 2017-02-15 DIAGNOSIS — K219 Gastro-esophageal reflux disease without esophagitis: Secondary | ICD-10-CM | POA: Diagnosis not present

## 2017-02-15 DIAGNOSIS — K589 Irritable bowel syndrome without diarrhea: Secondary | ICD-10-CM | POA: Diagnosis not present

## 2017-02-15 DIAGNOSIS — E785 Hyperlipidemia, unspecified: Secondary | ICD-10-CM | POA: Diagnosis not present

## 2017-02-15 DIAGNOSIS — R2689 Other abnormalities of gait and mobility: Secondary | ICD-10-CM | POA: Diagnosis not present

## 2017-02-15 DIAGNOSIS — Z96651 Presence of right artificial knee joint: Secondary | ICD-10-CM | POA: Diagnosis not present

## 2017-02-15 DIAGNOSIS — N390 Urinary tract infection, site not specified: Secondary | ICD-10-CM | POA: Diagnosis not present

## 2017-02-15 DIAGNOSIS — I10 Essential (primary) hypertension: Secondary | ICD-10-CM | POA: Diagnosis not present

## 2017-02-17 ENCOUNTER — Other Ambulatory Visit: Payer: Self-pay | Admitting: *Deleted

## 2017-02-17 NOTE — Patient Outreach (Signed)
[facility]) Care Management  02/17/2017  Holly Robbins 1941-07-01 [id number]   Met with patient, spouse and daughter at facility. Patient reports she had not planned to go to SNF but had planned to go straight home from hospital after her surgery.  Patient goal is to be walking before leaving SNF, patient on precautions due to an infection.   RNCM reviewed [facility] program services. Patient wants to review information.  RNCM left [facility] brochure and will follow up as indicated.  Patient verbalizes understanding she may receive a call from [facility] after discharge.   Met with [name], SW at facility, he states patient may need support after going home as she is making slow progress.   [name]. [name], RN, BSN, [place] [phone]) Business Cell  [phone]) Toll Free Office

## 2017-02-22 DIAGNOSIS — E785 Hyperlipidemia, unspecified: Secondary | ICD-10-CM | POA: Diagnosis not present

## 2017-02-22 DIAGNOSIS — Z96651 Presence of right artificial knee joint: Secondary | ICD-10-CM | POA: Diagnosis not present

## 2017-02-22 DIAGNOSIS — I10 Essential (primary) hypertension: Secondary | ICD-10-CM | POA: Diagnosis not present

## 2017-02-22 DIAGNOSIS — K219 Gastro-esophageal reflux disease without esophagitis: Secondary | ICD-10-CM | POA: Diagnosis not present

## 2017-02-22 DIAGNOSIS — K589 Irritable bowel syndrome without diarrhea: Secondary | ICD-10-CM | POA: Diagnosis not present

## 2017-02-22 DIAGNOSIS — R2689 Other abnormalities of gait and mobility: Secondary | ICD-10-CM | POA: Diagnosis not present

## 2017-02-22 DIAGNOSIS — D649 Anemia, unspecified: Secondary | ICD-10-CM | POA: Diagnosis not present

## 2017-03-04 DIAGNOSIS — F419 Anxiety disorder, unspecified: Secondary | ICD-10-CM | POA: Diagnosis not present

## 2017-03-04 DIAGNOSIS — K582 Mixed irritable bowel syndrome: Secondary | ICD-10-CM | POA: Diagnosis not present

## 2017-03-04 DIAGNOSIS — I10 Essential (primary) hypertension: Secondary | ICD-10-CM | POA: Diagnosis not present

## 2017-03-04 DIAGNOSIS — R339 Retention of urine, unspecified: Secondary | ICD-10-CM | POA: Diagnosis not present

## 2017-03-04 DIAGNOSIS — Z471 Aftercare following joint replacement surgery: Secondary | ICD-10-CM | POA: Diagnosis not present

## 2017-03-07 ENCOUNTER — Encounter: Payer: Self-pay | Admitting: Emergency Medicine

## 2017-03-07 ENCOUNTER — Emergency Department
Admission: EM | Admit: 2017-03-07 | Discharge: 2017-03-07 | Disposition: A | Discharge status: Home / Self Care | Payer: PPO | Attending: Emergency Medicine | Admitting: Emergency Medicine

## 2017-03-07 DIAGNOSIS — R000 Tachycardia, unspecified: Secondary | ICD-10-CM | POA: Diagnosis not present

## 2017-03-07 DIAGNOSIS — I10 Essential (primary) hypertension: Secondary | ICD-10-CM | POA: Insufficient documentation

## 2017-03-07 DIAGNOSIS — R197 Diarrhea, unspecified: Secondary | ICD-10-CM

## 2017-03-07 DIAGNOSIS — Z79899 Other long term (current) drug therapy: Secondary | ICD-10-CM | POA: Diagnosis not present

## 2017-03-07 DIAGNOSIS — Z87891 Personal history of nicotine dependence: Secondary | ICD-10-CM | POA: Diagnosis not present

## 2017-03-07 DIAGNOSIS — E785 Hyperlipidemia, unspecified: Secondary | ICD-10-CM | POA: Diagnosis not present

## 2017-03-07 DIAGNOSIS — Z7982 Long term (current) use of aspirin: Secondary | ICD-10-CM | POA: Insufficient documentation

## 2017-03-07 LAB — COMPREHENSIVE METABOLIC PANEL
ALBUMIN: 3 g/dL — AB (ref 3.5–5.0)
ALT: 12 U/L — ABNORMAL LOW (ref 14–54)
ANION GAP: 10 (ref 5–15)
AST: 15 U/L (ref 15–41)
Alkaline Phosphatase: 58 U/L (ref 38–126)
BILIRUBIN TOTAL: 0.9 mg/dL (ref 0.3–1.2)
BUN: 21 mg/dL — AB (ref 6–20)
CHLORIDE: 101 mmol/L (ref 101–111)
CO2: 25 mmol/L (ref 22–32)
Calcium: 9 mg/dL (ref 8.9–10.3)
Creatinine, Ser: 0.91 mg/dL (ref 0.44–1.00)
GFR, EST NON AFRICAN AMERICAN: 59 mL/min — AB (ref >60)
Glucose, Bld: 115 mg/dL — ABNORMAL HIGH (ref 65–99)
POTASSIUM: 3.4 mmol/L — AB (ref 3.5–5.1)
SODIUM: 136 mmol/L (ref 135–145)
TOTAL PROTEIN: 6.6 g/dL (ref 6.5–8.1)

## 2017-03-07 LAB — CBC
HEMATOCRIT: 34.9 % — AB (ref 35.0–47.0)
HEMOGLOBIN: 11.7 g/dL — AB (ref 12.0–16.0)
MCH: 30.1 pg (ref 26.0–34.0)
MCHC: 33.6 g/dL (ref 32.0–36.0)
MCV: 89.6 fL (ref 80.0–100.0)
Platelets: 434 10*3/uL (ref 150–440)
RBC: 3.9 MIL/uL (ref 3.80–5.20)
RDW: 15.1 % — AB (ref 11.5–14.5)
WBC: 12.4 10*3/uL — AB (ref 3.6–11.0)

## 2017-03-07 LAB — LIPASE, BLOOD: LIPASE: 19 U/L (ref 11–51)

## 2017-03-07 NOTE — ED Notes (Signed)
Fecal occult "trace positive" per Dr. Lenard LancePaduchowski.

## 2017-03-07 NOTE — ED Provider Notes (Signed)
[facility] Emergency Department Provider Note  Time seen: 4:42 PM  I have reviewed the triage vital signs and the nursing notes.   HISTORY  Chief Complaint Hypotension and Diarrhea    HPI Holly Robbins is a 76 y.o. female With a past medical history of gastric reflux, hypertension, presents to the emergency Department for diarrhea.According to the patient she had a right knee replacement approximately one month ago. She was placed on antibiotics by her primary care doctor for a low-grade fever of unknown origin She continued antibiotics and rehabilitationand discontinued them approximately 1-2 weeks ago. She states for the past one week she has been having a significant amount of diarrhea. She states initially she was having diarrhea every hour or so during the day. It has slowed down because she is taking large amounts of Imodium. Denies any bloody stool but states it has been dark over the past several days but she is also been taking Pepto-Bismol. Denies any abdominal pain dysuria, fever, chest pain or shortness of breath.  Past Medical History:  Diagnosis Date  . Arthritis   . GERD (gastroesophageal reflux disease)   . History of hiatal hernia   . Hypertension   . Spinal headache 1956   block for delivery of son    Patient Active Problem List   Diagnosis Date Noted  . Primary osteoarthritis of right knee 02/08/2017  . Arthritis 09/28/2016  . GERD (gastroesophageal reflux disease) 09/28/2016  . History of hematuria 09/28/2016  . Hyperlipidemia, unspecified 09/28/2016  . Hypertension 09/28/2016  . IBS (irritable bowel syndrome) 09/28/2016  . Psoriasis 09/28/2016  . DDD (degenerative disc disease), thoracolumbar 04/18/2014    Past Surgical History:  Procedure Laterality Date  . ABDOMINAL HYSTERECTOMY    . TOTAL KNEE ARTHROPLASTY Right 02/08/2017   Procedure: TOTAL KNEE ARTHROPLASTY;  Surgeon: Kennedy Bucker, MD;  Location: [facility] ORS;  Service:  Orthopedics;  Laterality: Right;  . TUBAL LIGATION      Prior to Admission medications   Medication Sig Start Date End Date Taking? Authorizing Provider  aspirin EC 81 MG tablet Take 81 mg by mouth daily.     [provider]  B Complex-C (B-COMPLEX WITH VITAMIN C) tablet Take 1 tablet by mouth daily.    [provider]  BIOTIN PO Take 1 tablet by mouth daily.    [provider]  calcium carbonate (CALCIUM 600) 600 MG TABS tablet Take 600 mg by mouth 2 (two) times daily.     [provider]  Cholecalciferol (VITAMIN D3) 5000 units CAPS Take 1 capsule by mouth every morning.    [provider]  CRANBERRY CONCENTRATE PO Take 25,000 mg by mouth every morning.    [provider]  Cyanocobalamin (VITAMIN B-12 CR) 1500 MCG TBCR Take 1,500 mcg by mouth daily.    [provider]  diazepam (VALIUM) 5 MG tablet Take 5 mg by mouth 2 (two) times daily as needed. For anxiety/sleep. 11/10/16   [provider]  diclofenac (VOLTAREN) 75 MG EC tablet Take 75 mg by mouth 2 (two) times daily as needed (for pain (scheduled in the morning)).    [provider]  dicyclomine (BENTYL) 20 MG tablet Take 20 mg by mouth See admin instructions. Take 1 tablet (20 mg) by mouth scheduled every morning, may repeat dose if needed for IBS    [provider]  enoxaparin (LOVENOX) 30 MG/0.3ML injection Inject 0.3 mLs (30 mg total) into the skin every 12 (twelve)  hours. 02/11/17   Anson OregonMcGhee, James Lance, PA-C  Glucosamine-Chondroit-Vit C-Mn (GLUCOSAMINE CHONDR 1500 COMPLX PO) Take 1 tablet by mouth every morning.    [provider]  L-Lysine 500 MG CAPS Take 1 capsule by mouth every morning.    [provider]  lisinopril-hydrochlorothiazide (PRINZIDE,ZESTORETIC) 20-12.5 MG tablet Take 1 tablet by mouth daily.    [provider]  MAGNESIUM PO Take 1 tablet by mouth daily.    [provider]  metroNIDAZOLE (FLAGYL)  500 MG tablet Take 500 mg by mouth 3 (three) times daily.    [provider]  Multiple Vitamin (MULTIVITAMIN WITH MINERALS) TABS tablet Take 1 tablet by mouth daily.    [provider]  Omega-3 Fatty Acids (FISH OIL PO) Take 1 capsule by mouth at bedtime.    [provider]  oxyCODONE (OXY IR/ROXICODONE) 5 MG immediate release tablet Take 1-2 tablets (5-10 mg total) by mouth every 4 (four) hours as needed for breakthrough pain. 02/11/17   Anson OregonMcGhee, James Lance, PA-C  pravastatin (PRAVACHOL) 20 MG tablet Take 20 mg by mouth daily.    [provider]  Probiotic Product (PROBIOTIC COLON SUPPORT PO) Take 1 capsule by mouth every morning.    [provider]  psyllium (METAMUCIL) 58.6 % powder Take 1 packet by mouth at bedtime. 2 TEASPOONSFULS IN A GLASS OF WATER    [provider]  pyridOXINE (VITAMIN B-6) 100 MG tablet Take 100 mg by mouth daily.    [provider]  ranitidine (ZANTAC) 300 MG capsule Take 300 mg by mouth daily at 8 pm.  11/20/14   [provider]  vitamin E 1000 UNIT capsule Take 1,000 Units by mouth daily.    [provider]    Allergies  Allergen Reactions  . Adhesive [Tape] Other (See Comments)    Blistering (PAPER TAPE IS OKAY)  . Atorvastatin     Other reaction(s): Muscle Pain  . Contrast Media [Iodinated Diagnostic Agents] Hives  . Eggs Or Egg-Derived Products Nausea Only    Severe abdominal pain  . Erythromycin Hives  . Latex Other (See Comments)    Blisters on skin  . Penicillins Hives    Has patient had a PCN reaction causing immediate rash, facial/tongue/throat swelling, SOB or lightheadedness with hypotension:No Has patient had a PCN reaction causing severe rash involving mucus membranes or skin necrosis:Yes Has patient had a PCN reaction that required hospitalization:No Has patient had a PCN reaction occurring within the last 10 years:No If all of the above answers are "NO", then may  proceed with Cephalosporin use.     Family History  Problem Relation Age of Onset  . Chronic Renal Failure Father   . Prostate cancer Father   . Hematuria Sister   . Chronic Renal Failure Son   . Breast cancer Neg Hx     Social History Social History  Substance Use Topics  . Smoking status: Former Smoker    Quit date: 1992  . Smokeless tobacco: Never Used  . Alcohol use No    Review of Systems Constitutional: Negative for fever. Cardiovascular: Negative for chest pain. Respiratory: Negative for shortness of breath. Gastrointestinal: Negative for abdominal pain. Positive for diarrhea. Genitourinary: Negative for dysuria. Neurological: Negative for headache All other ROS negative  ____________________________________________   PHYSICAL EXAM:  VITAL SIGNS: ED Triage Vitals  Enc Vitals Group     BP 03/07/17 1506 (!) 84/45     Pulse Rate 03/07/17 1506 (!) 102  Resp 03/07/17 1506 20     Temp 03/07/17 1506 99.6 F (37.6 C)     Temp Source 03/07/17 1506 Oral     SpO2 03/07/17 1506 94 %     Weight 03/07/17 1507 166 lb (75.3 kg)     Height 03/07/17 1507 5\' 3"  (1.6 m)     Head Circumference --      [facility] Flow --      Pain Score 03/07/17 1520 0     Pain Loc --      Pain Edu? --      Excl. in GC? --     Constitutional: Alert and oriented. Well appearing and in no distress. Eyes: Normal exam ENT   Head: Normocephalic and atraumatic.   Mouth/Throat: Mucous membranes are moist. Cardiovascular: Normal rate, regular rhythm. No murmur Respiratory: Normal respiratory effort without tachypnea nor retractions. Breath sounds are clear Gastrointestinal: Soft and nontender. No distention.  Musculoskeletal: Nontender with normal range of motion in all extremities. Neurologic:  Normal speech and language. No gross focal neurologic deficits Skin:  Skin is warm, dry and intact.  Psychiatric: Mood and affect are normal.   ____________________________________________     EKG  EKG reviewed and interpreted by myself shows sinus tachycardia 102 bpm, narrow QRS, normal axis, normal intervals with no concerning ST changes.  ____________________________________________   INITIAL IMPRESSION / ASSESSMENT AND PLAN / ED COURSE  Pertinent labs & imaging results that were available during my care of the patient were reviewed by me and considered in my medical decision making (see chart for details).  the patient presents to the emergency department for 1 week of diarrhea. No abdominal pain, nontender abdomen. On rectal exam she does have dark brown stool that is trace guaiac positive. She also has hemorrhoids that do not appear actively inflamed. Overall the patient appears very well, vitals are reassuring besides a low-grade temperature 99.6. White blood cell count is slightly elevated at 12,400. We will closely monitor the patient in the emergency department. We'll obtain a C. difficile and stool panel.  patient has been unable to provide a stool sample after multiple hours in the emergency department. She is now asking to be discharged home as she feels tired and wants to rest. I sent a message to her primary care doctor Dr. Judithann Sheen regarding this. I also send the patient home with a stool collection cup and bag. I discussed return precautions. Patient agreeable.  ____________________________________________   FINAL CLINICAL IMPRESSION(S) / ED DIAGNOSES  diarrhea    Minna Antis, MD 03/07/17 [phone]

## 2017-03-07 NOTE — ED Triage Notes (Signed)
Pt reports history of knee replacement in July. Pt reports went to rehab facility. Pt reports history of low blood pressure there. Pt reports diarrhea for over one week. Pt reports taking antibiotics during stay for knee replacement. Pt alert and oriented. No apparent distress noted.

## 2017-03-07 NOTE — ED Notes (Signed)
Pt ambulatory to toilet with assistance. Only 1 drop of stool noted. Pt informed that is not enough and will try again later.

## 2017-03-07 NOTE — ED Notes (Signed)
Pt on toilet attempting to get a sample.

## 2017-03-07 NOTE — ED Notes (Signed)
Pt stated that she just wants to go home and take a shower. Still no stool sample. Pt stated she hasn't eaten much and probably doesn't have any stool left in her. Pt asked about outpatient stool sample for whenever she could get a sample at home. Dr. Lenard LancePaduchowski informed.

## 2017-03-07 NOTE — ED Notes (Signed)
Pt states she had knee replacement about a month ago, was at [facility] x 20 days. Pt states prior to surgery she was placed on cipro and flagyl for UTI. States she took 8 days of it before knee surgery, was given more of those antibiotics while in hospital for surgery. States continued antibiotics plus surgery antibiotics once DC to [facility]. States then placed on antibiotics for UTI again within last month. Pt states decreased appetite. States stool x >10 a day for over a week. States stool is yellow in color. Denies blood in stool. Alert, oriented, assisted from wheelchair to stretcher.

## 2017-03-07 NOTE — ED Notes (Signed)
Dr. Paduchowski at bedside.  

## 2017-03-07 NOTE — ED Notes (Signed)
Pt given crackers and water, ok with Dr. Lenard LancePaduchowski.

## 2017-03-08 ENCOUNTER — Other Ambulatory Visit
Admission: RE | Admit: 2017-03-08 | Discharge: 2017-03-08 | Disposition: A | Discharge status: Home / Self Care | Payer: PPO | Source: Ambulatory Visit | Attending: Internal Medicine | Admitting: Internal Medicine

## 2017-03-08 ENCOUNTER — Other Ambulatory Visit: Payer: Self-pay | Admitting: Internal Medicine

## 2017-03-08 DIAGNOSIS — R197 Diarrhea, unspecified: Secondary | ICD-10-CM | POA: Diagnosis not present

## 2017-03-08 LAB — C DIFFICILE QUICK SCREEN W PCR REFLEX
C DIFFICLE (CDIFF) ANTIGEN: POSITIVE — AB
C Diff toxin: NEGATIVE

## 2017-03-08 LAB — CLOSTRIDIUM DIFFICILE BY PCR: Toxigenic C. Difficile by PCR: POSITIVE — AB

## 2017-03-16 ENCOUNTER — Other Ambulatory Visit: Payer: Self-pay | Admitting: Internal Medicine

## 2017-03-16 DIAGNOSIS — Z96651 Presence of right artificial knee joint: Secondary | ICD-10-CM | POA: Diagnosis not present

## 2017-03-16 DIAGNOSIS — R1084 Generalized abdominal pain: Secondary | ICD-10-CM

## 2017-03-16 DIAGNOSIS — N390 Urinary tract infection, site not specified: Secondary | ICD-10-CM | POA: Diagnosis not present

## 2017-03-16 DIAGNOSIS — E7800 Pure hypercholesterolemia, unspecified: Secondary | ICD-10-CM | POA: Diagnosis not present

## 2017-03-16 DIAGNOSIS — I10 Essential (primary) hypertension: Secondary | ICD-10-CM | POA: Diagnosis not present

## 2017-03-21 DIAGNOSIS — R1084 Generalized abdominal pain: Secondary | ICD-10-CM | POA: Diagnosis not present

## 2017-03-23 DIAGNOSIS — Z96651 Presence of right artificial knee joint: Secondary | ICD-10-CM | POA: Diagnosis not present

## 2017-03-24 DIAGNOSIS — M25561 Pain in right knee: Secondary | ICD-10-CM | POA: Diagnosis not present

## 2017-03-24 DIAGNOSIS — G8929 Other chronic pain: Secondary | ICD-10-CM | POA: Diagnosis not present

## 2017-03-24 DIAGNOSIS — Z96651 Presence of right artificial knee joint: Secondary | ICD-10-CM | POA: Diagnosis not present

## 2017-03-24 DIAGNOSIS — M25661 Stiffness of right knee, not elsewhere classified: Secondary | ICD-10-CM | POA: Diagnosis not present

## 2017-03-24 DIAGNOSIS — M6281 Muscle weakness (generalized): Secondary | ICD-10-CM | POA: Diagnosis not present

## 2017-03-29 DIAGNOSIS — M6281 Muscle weakness (generalized): Secondary | ICD-10-CM | POA: Diagnosis not present

## 2017-03-29 DIAGNOSIS — Z96651 Presence of right artificial knee joint: Secondary | ICD-10-CM | POA: Diagnosis not present

## 2017-03-29 DIAGNOSIS — M25661 Stiffness of right knee, not elsewhere classified: Secondary | ICD-10-CM | POA: Diagnosis not present

## 2017-03-29 DIAGNOSIS — G8929 Other chronic pain: Secondary | ICD-10-CM | POA: Diagnosis not present

## 2017-03-29 DIAGNOSIS — M25561 Pain in right knee: Secondary | ICD-10-CM | POA: Diagnosis not present

## 2017-03-31 DIAGNOSIS — M6281 Muscle weakness (generalized): Secondary | ICD-10-CM | POA: Diagnosis not present

## 2017-03-31 DIAGNOSIS — M25661 Stiffness of right knee, not elsewhere classified: Secondary | ICD-10-CM | POA: Diagnosis not present

## 2017-03-31 DIAGNOSIS — Z96651 Presence of right artificial knee joint: Secondary | ICD-10-CM | POA: Diagnosis not present

## 2017-04-05 DIAGNOSIS — M6281 Muscle weakness (generalized): Secondary | ICD-10-CM | POA: Diagnosis not present

## 2017-04-05 DIAGNOSIS — M25561 Pain in right knee: Secondary | ICD-10-CM | POA: Diagnosis not present

## 2017-04-05 DIAGNOSIS — Z96651 Presence of right artificial knee joint: Secondary | ICD-10-CM | POA: Diagnosis not present

## 2017-04-05 DIAGNOSIS — M25661 Stiffness of right knee, not elsewhere classified: Secondary | ICD-10-CM | POA: Diagnosis not present

## 2017-04-05 DIAGNOSIS — G8929 Other chronic pain: Secondary | ICD-10-CM | POA: Diagnosis not present

## 2017-04-06 ENCOUNTER — Ambulatory Visit: Payer: PPO

## 2017-04-07 DIAGNOSIS — M25561 Pain in right knee: Secondary | ICD-10-CM | POA: Diagnosis not present

## 2017-04-07 DIAGNOSIS — M25661 Stiffness of right knee, not elsewhere classified: Secondary | ICD-10-CM | POA: Diagnosis not present

## 2017-04-07 DIAGNOSIS — Z96651 Presence of right artificial knee joint: Secondary | ICD-10-CM | POA: Diagnosis not present

## 2017-04-07 DIAGNOSIS — M6281 Muscle weakness (generalized): Secondary | ICD-10-CM | POA: Diagnosis not present

## 2017-04-07 DIAGNOSIS — G8929 Other chronic pain: Secondary | ICD-10-CM | POA: Diagnosis not present

## 2017-04-12 DIAGNOSIS — M6281 Muscle weakness (generalized): Secondary | ICD-10-CM | POA: Diagnosis not present

## 2017-04-12 DIAGNOSIS — Z96651 Presence of right artificial knee joint: Secondary | ICD-10-CM | POA: Diagnosis not present

## 2017-04-14 DIAGNOSIS — G8929 Other chronic pain: Secondary | ICD-10-CM | POA: Diagnosis not present

## 2017-04-14 DIAGNOSIS — M25661 Stiffness of right knee, not elsewhere classified: Secondary | ICD-10-CM | POA: Diagnosis not present

## 2017-04-14 DIAGNOSIS — Z96651 Presence of right artificial knee joint: Secondary | ICD-10-CM | POA: Diagnosis not present

## 2017-04-14 DIAGNOSIS — M25561 Pain in right knee: Secondary | ICD-10-CM | POA: Diagnosis not present

## 2017-04-14 DIAGNOSIS — M6281 Muscle weakness (generalized): Secondary | ICD-10-CM | POA: Diagnosis not present

## 2017-04-21 DIAGNOSIS — Z79899 Other long term (current) drug therapy: Secondary | ICD-10-CM | POA: Diagnosis not present

## 2017-04-21 DIAGNOSIS — E7800 Pure hypercholesterolemia, unspecified: Secondary | ICD-10-CM | POA: Diagnosis not present

## 2017-04-21 DIAGNOSIS — R3129 Other microscopic hematuria: Secondary | ICD-10-CM | POA: Diagnosis not present

## 2017-04-21 DIAGNOSIS — Z0000 Encounter for general adult medical examination without abnormal findings: Secondary | ICD-10-CM | POA: Diagnosis not present

## 2017-04-21 DIAGNOSIS — Z1231 Encounter for screening mammogram for malignant neoplasm of breast: Secondary | ICD-10-CM | POA: Diagnosis not present

## 2017-04-21 DIAGNOSIS — N390 Urinary tract infection, site not specified: Secondary | ICD-10-CM | POA: Diagnosis not present

## 2017-04-21 DIAGNOSIS — I10 Essential (primary) hypertension: Secondary | ICD-10-CM | POA: Diagnosis not present

## 2017-04-21 DIAGNOSIS — Z96651 Presence of right artificial knee joint: Secondary | ICD-10-CM | POA: Diagnosis not present

## 2017-05-13 ENCOUNTER — Other Ambulatory Visit: Payer: Self-pay | Admitting: Family Medicine

## 2017-05-13 DIAGNOSIS — G44209 Tension-type headache, unspecified, not intractable: Secondary | ICD-10-CM

## 2017-05-13 DIAGNOSIS — G459 Transient cerebral ischemic attack, unspecified: Secondary | ICD-10-CM

## 2017-05-13 DIAGNOSIS — I10 Essential (primary) hypertension: Secondary | ICD-10-CM

## 2017-05-13 DIAGNOSIS — R51 Headache: Secondary | ICD-10-CM | POA: Diagnosis not present

## 2017-05-17 ENCOUNTER — Ambulatory Visit
Admission: RE | Admit: 2017-05-17 | Discharge: 2017-05-17 | Disposition: A | Discharge status: Home / Self Care | Payer: PPO | Source: Ambulatory Visit | Attending: Family Medicine | Admitting: Family Medicine

## 2017-05-17 DIAGNOSIS — I6523 Occlusion and stenosis of bilateral carotid arteries: Secondary | ICD-10-CM | POA: Diagnosis not present

## 2017-05-17 DIAGNOSIS — R9082 White matter disease, unspecified: Secondary | ICD-10-CM | POA: Insufficient documentation

## 2017-05-17 DIAGNOSIS — I10 Essential (primary) hypertension: Secondary | ICD-10-CM

## 2017-05-17 DIAGNOSIS — R4781 Slurred speech: Secondary | ICD-10-CM | POA: Diagnosis not present

## 2017-05-17 DIAGNOSIS — G459 Transient cerebral ischemic attack, unspecified: Secondary | ICD-10-CM | POA: Diagnosis present

## 2017-05-17 DIAGNOSIS — G44209 Tension-type headache, unspecified, not intractable: Secondary | ICD-10-CM

## 2017-05-17 DIAGNOSIS — G319 Degenerative disease of nervous system, unspecified: Secondary | ICD-10-CM | POA: Insufficient documentation

## 2017-05-17 DIAGNOSIS — R51 Headache: Secondary | ICD-10-CM | POA: Diagnosis present

## 2017-05-20 DIAGNOSIS — G459 Transient cerebral ischemic attack, unspecified: Secondary | ICD-10-CM | POA: Diagnosis not present

## 2017-05-22 DIAGNOSIS — R29703 NIHSS score 3: Secondary | ICD-10-CM | POA: Diagnosis not present

## 2017-05-22 DIAGNOSIS — R4701 Aphasia: Secondary | ICD-10-CM | POA: Diagnosis not present

## 2017-05-22 DIAGNOSIS — B9620 Unspecified Escherichia coli [E. coli] as the cause of diseases classified elsewhere: Secondary | ICD-10-CM | POA: Diagnosis not present

## 2017-05-22 DIAGNOSIS — G459 Transient cerebral ischemic attack, unspecified: Secondary | ICD-10-CM | POA: Diagnosis not present

## 2017-05-22 DIAGNOSIS — E785 Hyperlipidemia, unspecified: Secondary | ICD-10-CM | POA: Diagnosis not present

## 2017-05-22 DIAGNOSIS — G4700 Insomnia, unspecified: Secondary | ICD-10-CM | POA: Diagnosis not present

## 2017-05-22 DIAGNOSIS — R930 Abnormal findings on diagnostic imaging of skull and head, not elsewhere classified: Secondary | ICD-10-CM | POA: Diagnosis not present

## 2017-05-22 DIAGNOSIS — R29810 Facial weakness: Secondary | ICD-10-CM | POA: Diagnosis not present

## 2017-05-22 DIAGNOSIS — N390 Urinary tract infection, site not specified: Secondary | ICD-10-CM | POA: Diagnosis not present

## 2017-05-22 DIAGNOSIS — I10 Essential (primary) hypertension: Secondary | ICD-10-CM | POA: Diagnosis not present

## 2017-05-22 DIAGNOSIS — R569 Unspecified convulsions: Secondary | ICD-10-CM | POA: Diagnosis not present

## 2017-05-22 DIAGNOSIS — R4182 Altered mental status, unspecified: Secondary | ICD-10-CM | POA: Diagnosis not present

## 2017-05-22 DIAGNOSIS — I771 Stricture of artery: Secondary | ICD-10-CM | POA: Diagnosis not present

## 2017-05-22 DIAGNOSIS — Z7982 Long term (current) use of aspirin: Secondary | ICD-10-CM | POA: Diagnosis not present

## 2017-05-22 DIAGNOSIS — I639 Cerebral infarction, unspecified: Secondary | ICD-10-CM | POA: Diagnosis not present

## 2017-05-22 DIAGNOSIS — Z8673 Personal history of transient ischemic attack (TIA), and cerebral infarction without residual deficits: Secondary | ICD-10-CM | POA: Diagnosis not present

## 2017-05-25 ENCOUNTER — Other Ambulatory Visit: Payer: Self-pay

## 2017-05-25 NOTE — Patient Outreach (Signed)
[facility] Care Management  05/25/2017  Holly Robbins September 06, 1940 [id number]  Referral Date: 05-25-17 Referral Source:  HTA report Date of Admission:  05-22-17 Diagnosis:  Stroke/TIA Date of Discharge:05-23-17 [facility] Insurance: HTA  Outreach attempt # 1  Social: Spoke with patient.  She is able to verify HIPAA.  Patient lives in the home with her spouse who is supportive.   Conditions: Patient reports they thought she was having a stroke but thinks it was a TIA. Patient reports confusion and weakness as her symptoms.  Patient also reports that she had a knee replacement in August and went to a nursing home for rehab.  Patient denies any problems or after affects from the TIA.  She reports she has had one before and came out fine.  Discussed with patient if symptoms return to seek help immediately.  She verbalized understanding.    Medications:  Patient reports she had all her prescriptions filled and taking as prescribed. No questions voices about medications.  Appointments: Patient has an appointment with her primary care doctor on 05-31-17 and does have transportation.  Consent: RN [facility]. Patient denies any needs at this time.  No needs upon nurse assessment as well.   Plan: RN CM will close case at this time as patient has been assessed and no needs identified.  RN CM will send letter and brochure for future reference.  RN CM will notify care management assistant of case status.  Bary Lericheionne J Breniya Goertzen, RN, MSN [facility] Care Management Care Management Coordinator Direct Line [phone] Toll Free: [phone]  Fax: [phone]

## 2017-05-31 DIAGNOSIS — I10 Essential (primary) hypertension: Secondary | ICD-10-CM | POA: Diagnosis not present

## 2017-05-31 DIAGNOSIS — M25561 Pain in right knee: Secondary | ICD-10-CM | POA: Diagnosis not present

## 2017-05-31 DIAGNOSIS — E7800 Pure hypercholesterolemia, unspecified: Secondary | ICD-10-CM | POA: Diagnosis not present

## 2017-05-31 DIAGNOSIS — G459 Transient cerebral ischemic attack, unspecified: Secondary | ICD-10-CM | POA: Diagnosis not present

## 2017-06-10 DIAGNOSIS — M76891 Other specified enthesopathies of right lower limb, excluding foot: Secondary | ICD-10-CM | POA: Diagnosis not present

## 2017-06-10 DIAGNOSIS — Z96651 Presence of right artificial knee joint: Secondary | ICD-10-CM | POA: Diagnosis not present

## 2017-06-13 ENCOUNTER — Other Ambulatory Visit: Payer: Self-pay | Admitting: Internal Medicine

## 2017-06-13 DIAGNOSIS — Z1231 Encounter for screening mammogram for malignant neoplasm of breast: Secondary | ICD-10-CM

## 2017-06-23 DIAGNOSIS — M25561 Pain in right knee: Secondary | ICD-10-CM | POA: Diagnosis not present

## 2017-06-23 DIAGNOSIS — Z96651 Presence of right artificial knee joint: Secondary | ICD-10-CM | POA: Diagnosis not present

## 2017-06-23 DIAGNOSIS — M25661 Stiffness of right knee, not elsewhere classified: Secondary | ICD-10-CM | POA: Diagnosis not present

## 2017-06-23 DIAGNOSIS — M6281 Muscle weakness (generalized): Secondary | ICD-10-CM | POA: Diagnosis not present

## 2017-06-23 DIAGNOSIS — G8929 Other chronic pain: Secondary | ICD-10-CM | POA: Diagnosis not present

## 2017-06-29 DIAGNOSIS — E7800 Pure hypercholesterolemia, unspecified: Secondary | ICD-10-CM | POA: Diagnosis not present

## 2017-06-29 DIAGNOSIS — I10 Essential (primary) hypertension: Secondary | ICD-10-CM | POA: Diagnosis not present

## 2017-06-29 DIAGNOSIS — Z79899 Other long term (current) drug therapy: Secondary | ICD-10-CM | POA: Diagnosis not present

## 2017-07-07 DIAGNOSIS — M6281 Muscle weakness (generalized): Secondary | ICD-10-CM | POA: Diagnosis not present

## 2017-07-07 DIAGNOSIS — Z96651 Presence of right artificial knee joint: Secondary | ICD-10-CM | POA: Diagnosis not present

## 2017-07-07 DIAGNOSIS — G8929 Other chronic pain: Secondary | ICD-10-CM | POA: Diagnosis not present

## 2017-07-07 DIAGNOSIS — M25561 Pain in right knee: Secondary | ICD-10-CM | POA: Diagnosis not present

## 2017-07-13 ENCOUNTER — Ambulatory Visit
Admission: RE | Admit: 2017-07-13 | Discharge: 2017-07-13 | Disposition: A | Discharge status: Home / Self Care | Payer: PPO | Source: Ambulatory Visit | Attending: Internal Medicine | Admitting: Internal Medicine

## 2017-07-13 DIAGNOSIS — Z1231 Encounter for screening mammogram for malignant neoplasm of breast: Secondary | ICD-10-CM | POA: Diagnosis not present

## 2017-07-28 DIAGNOSIS — M25561 Pain in right knee: Secondary | ICD-10-CM | POA: Diagnosis not present

## 2017-07-28 DIAGNOSIS — Z96651 Presence of right artificial knee joint: Secondary | ICD-10-CM | POA: Diagnosis not present

## 2017-07-28 DIAGNOSIS — M6281 Muscle weakness (generalized): Secondary | ICD-10-CM | POA: Diagnosis not present

## 2017-07-28 DIAGNOSIS — G8929 Other chronic pain: Secondary | ICD-10-CM | POA: Diagnosis not present

## 2017-08-01 DIAGNOSIS — R8290 Unspecified abnormal findings in urine: Secondary | ICD-10-CM | POA: Diagnosis not present

## 2017-08-01 DIAGNOSIS — E7800 Pure hypercholesterolemia, unspecified: Secondary | ICD-10-CM | POA: Diagnosis not present

## 2017-08-01 DIAGNOSIS — Z79899 Other long term (current) drug therapy: Secondary | ICD-10-CM | POA: Diagnosis not present

## 2017-08-01 DIAGNOSIS — I10 Essential (primary) hypertension: Secondary | ICD-10-CM | POA: Diagnosis not present

## 2017-08-08 DIAGNOSIS — Z0000 Encounter for general adult medical examination without abnormal findings: Secondary | ICD-10-CM | POA: Diagnosis not present

## 2017-08-08 DIAGNOSIS — E7800 Pure hypercholesterolemia, unspecified: Secondary | ICD-10-CM | POA: Diagnosis not present

## 2017-08-08 DIAGNOSIS — I10 Essential (primary) hypertension: Secondary | ICD-10-CM | POA: Diagnosis not present

## 2017-08-08 DIAGNOSIS — K589 Irritable bowel syndrome without diarrhea: Secondary | ICD-10-CM | POA: Diagnosis not present

## 2017-09-04 DIAGNOSIS — J0100 Acute maxillary sinusitis, unspecified: Secondary | ICD-10-CM | POA: Diagnosis not present

## 2017-11-07 DIAGNOSIS — M1990 Unspecified osteoarthritis, unspecified site: Secondary | ICD-10-CM | POA: Diagnosis not present

## 2017-11-07 DIAGNOSIS — Z0000 Encounter for general adult medical examination without abnormal findings: Secondary | ICD-10-CM | POA: Diagnosis not present

## 2017-11-07 DIAGNOSIS — Z79899 Other long term (current) drug therapy: Secondary | ICD-10-CM | POA: Diagnosis not present

## 2017-11-07 DIAGNOSIS — E7800 Pure hypercholesterolemia, unspecified: Secondary | ICD-10-CM | POA: Diagnosis not present

## 2017-11-07 DIAGNOSIS — M5135 Other intervertebral disc degeneration, thoracolumbar region: Secondary | ICD-10-CM | POA: Diagnosis not present

## 2017-11-07 DIAGNOSIS — I10 Essential (primary) hypertension: Secondary | ICD-10-CM | POA: Diagnosis not present

## 2017-11-17 DIAGNOSIS — H353131 Nonexudative age-related macular degeneration, bilateral, early dry stage: Secondary | ICD-10-CM | POA: Diagnosis not present

## 2017-11-17 DIAGNOSIS — H4389 Other disorders of vitreous body: Secondary | ICD-10-CM | POA: Diagnosis not present

## 2017-11-17 DIAGNOSIS — H2513 Age-related nuclear cataract, bilateral: Secondary | ICD-10-CM | POA: Diagnosis not present

## 2018-02-14 DIAGNOSIS — R8290 Unspecified abnormal findings in urine: Secondary | ICD-10-CM | POA: Diagnosis not present

## 2018-02-14 DIAGNOSIS — E7800 Pure hypercholesterolemia, unspecified: Secondary | ICD-10-CM | POA: Diagnosis not present

## 2018-02-14 DIAGNOSIS — I10 Essential (primary) hypertension: Secondary | ICD-10-CM | POA: Diagnosis not present

## 2018-02-14 DIAGNOSIS — Z79899 Other long term (current) drug therapy: Secondary | ICD-10-CM | POA: Diagnosis not present

## 2018-02-21 DIAGNOSIS — E7800 Pure hypercholesterolemia, unspecified: Secondary | ICD-10-CM | POA: Diagnosis not present

## 2018-02-21 DIAGNOSIS — I10 Essential (primary) hypertension: Secondary | ICD-10-CM | POA: Diagnosis not present

## 2018-06-01 DIAGNOSIS — E7800 Pure hypercholesterolemia, unspecified: Secondary | ICD-10-CM | POA: Diagnosis not present

## 2018-06-01 DIAGNOSIS — Z1239 Encounter for other screening for malignant neoplasm of breast: Secondary | ICD-10-CM | POA: Diagnosis not present

## 2018-06-01 DIAGNOSIS — I10 Essential (primary) hypertension: Secondary | ICD-10-CM | POA: Diagnosis not present

## 2018-06-01 DIAGNOSIS — N390 Urinary tract infection, site not specified: Secondary | ICD-10-CM | POA: Diagnosis not present

## 2018-06-02 ENCOUNTER — Other Ambulatory Visit: Payer: Self-pay | Admitting: Internal Medicine

## 2018-06-02 DIAGNOSIS — Z1231 Encounter for screening mammogram for malignant neoplasm of breast: Secondary | ICD-10-CM

## 2018-08-03 ENCOUNTER — Ambulatory Visit
Admission: RE | Admit: 2018-08-03 | Discharge: 2018-08-03 | Disposition: A | Discharge status: Home / Self Care | Payer: PPO | Source: Ambulatory Visit | Attending: Internal Medicine | Admitting: Internal Medicine

## 2018-08-03 DIAGNOSIS — Z1231 Encounter for screening mammogram for malignant neoplasm of breast: Secondary | ICD-10-CM | POA: Diagnosis not present

## 2018-09-14 DIAGNOSIS — Z0000 Encounter for general adult medical examination without abnormal findings: Secondary | ICD-10-CM | POA: Diagnosis not present

## 2018-09-14 DIAGNOSIS — E7800 Pure hypercholesterolemia, unspecified: Secondary | ICD-10-CM | POA: Diagnosis not present

## 2018-09-14 DIAGNOSIS — Z1211 Encounter for screening for malignant neoplasm of colon: Secondary | ICD-10-CM | POA: Diagnosis not present

## 2018-09-14 DIAGNOSIS — I10 Essential (primary) hypertension: Secondary | ICD-10-CM | POA: Diagnosis not present

## 2018-09-14 DIAGNOSIS — Z79899 Other long term (current) drug therapy: Secondary | ICD-10-CM | POA: Diagnosis not present

## 2018-09-19 DIAGNOSIS — Z1211 Encounter for screening for malignant neoplasm of colon: Secondary | ICD-10-CM | POA: Diagnosis not present

## 2019-01-02 DIAGNOSIS — I10 Essential (primary) hypertension: Secondary | ICD-10-CM | POA: Diagnosis not present

## 2019-01-02 DIAGNOSIS — R8290 Unspecified abnormal findings in urine: Secondary | ICD-10-CM | POA: Diagnosis not present

## 2019-01-02 DIAGNOSIS — E7800 Pure hypercholesterolemia, unspecified: Secondary | ICD-10-CM | POA: Diagnosis not present

## 2019-01-02 DIAGNOSIS — L409 Psoriasis, unspecified: Secondary | ICD-10-CM | POA: Diagnosis not present

## 2019-01-02 DIAGNOSIS — Z79899 Other long term (current) drug therapy: Secondary | ICD-10-CM | POA: Diagnosis not present

## 2019-01-11 IMAGING — CT CT ABD-PELV W/O CM
1 of 2 series · 14 of 32 positions shown, 18 images · non-contrast
Comparison: None.

CLINICAL DATA: Bladder infection about a month ago. Mid pelvic pain
and pressure with low-grade fever. Microscopic hematuria.

EXAM:
CT ABDOMEN AND PELVIS WITHOUT CONTRAST
TECHNIQUE: Multidetector CT imaging of the abdomen and pelvis was performed
following the standard protocol without IV contrast.

[Series 2: axial st · axial · 0.78mm/px · z∈[-962,-572]mm · 14 of 86 slices shown, 18 images]
[im 4/86  soft-tissue]
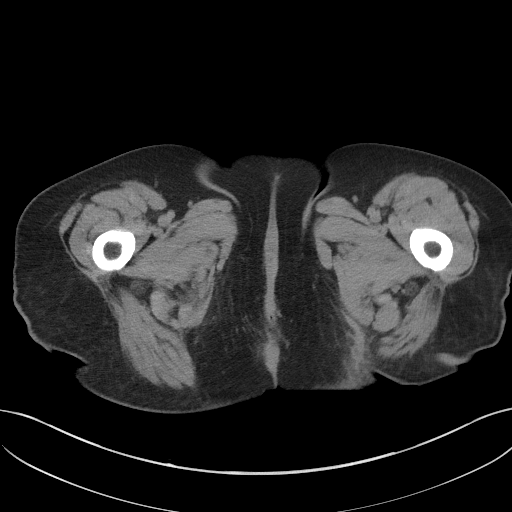
[im 4/86  bone]
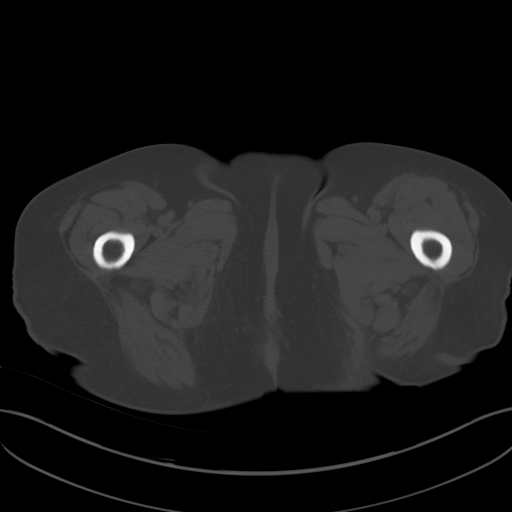
[im 11/86  soft-tissue]
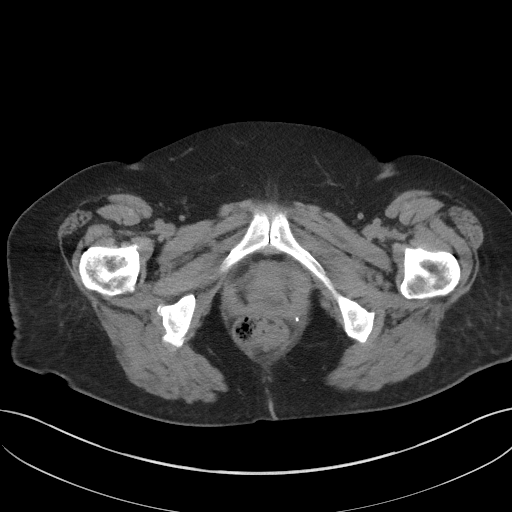
[im 18/86  soft-tissue]
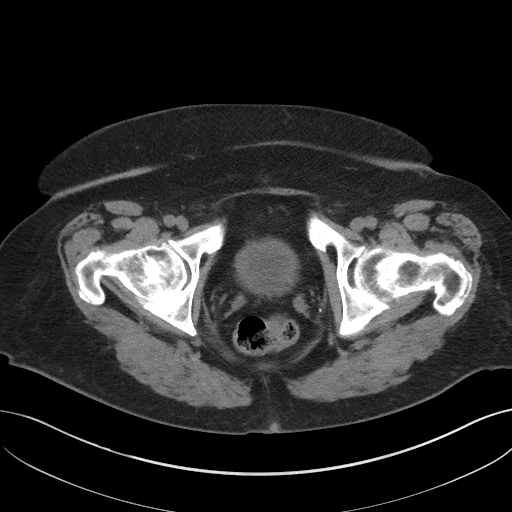
[im 25/86  soft-tissue]
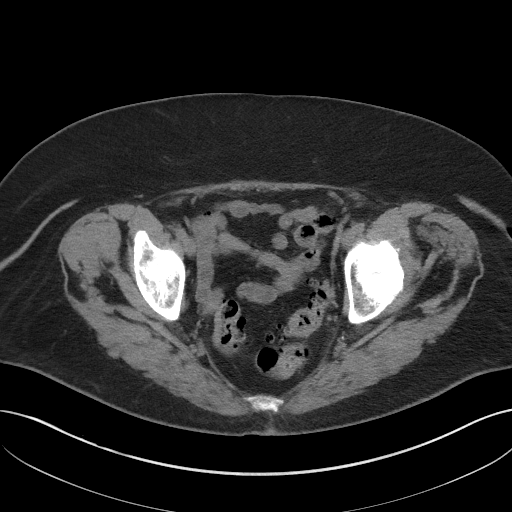
[im 32/86  soft-tissue]
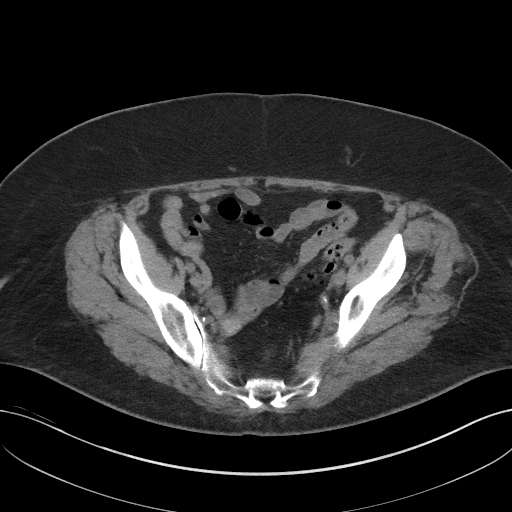
[im 39/86  soft-tissue]
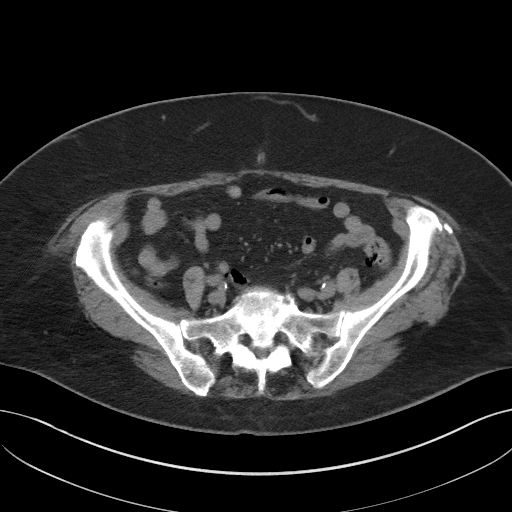
[im 47/86  soft-tissue]
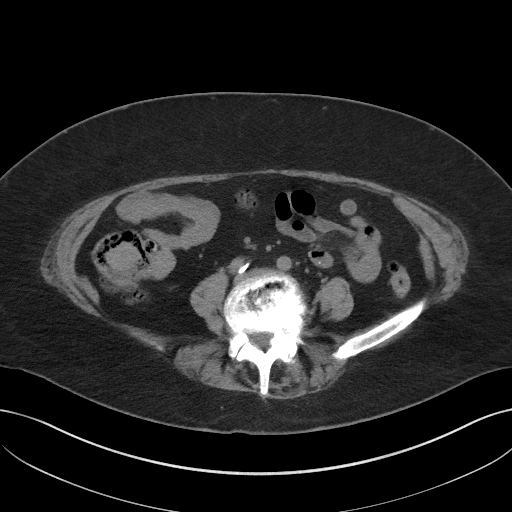
[im 54/86  soft-tissue]
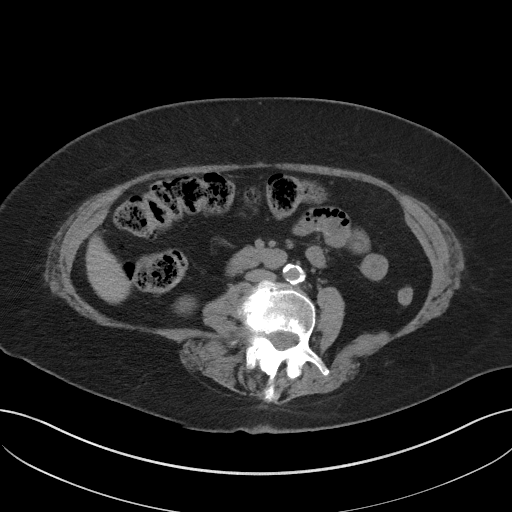
[im 61/86  soft-tissue]
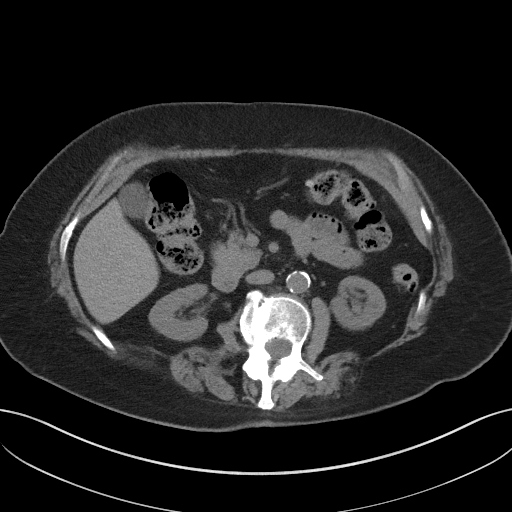
[im 61/86  bone]
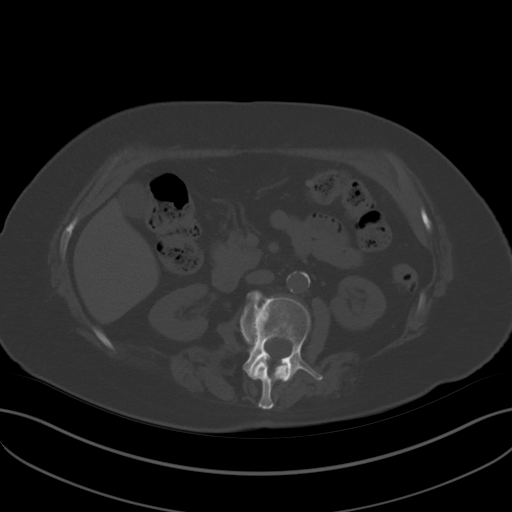
[im 68/86  soft-tissue]
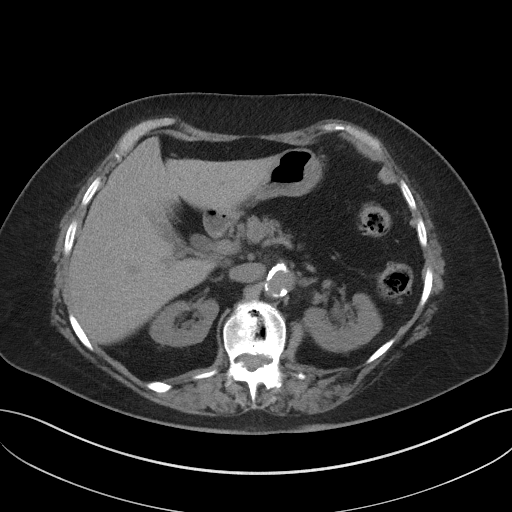
[im 71/86  lung]
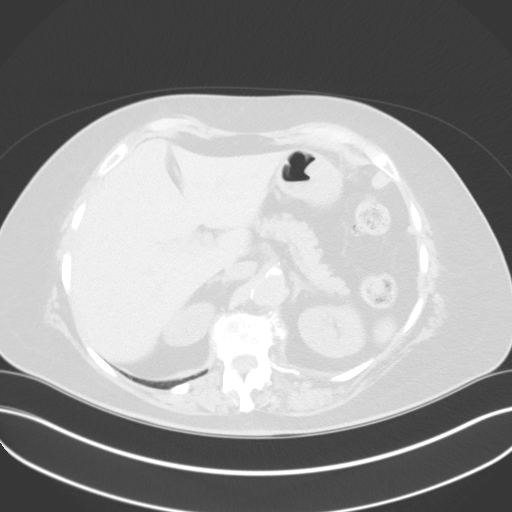
[im 75/86  soft-tissue]
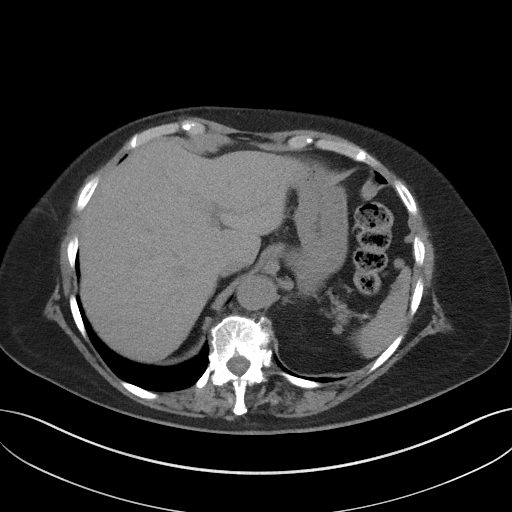
[im 75/86  lung]
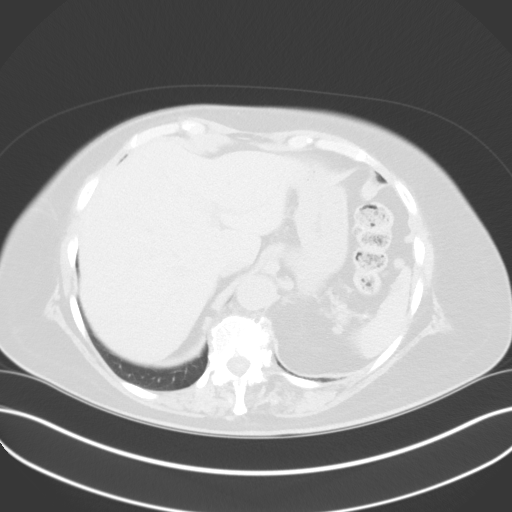
[im 78/86  lung]
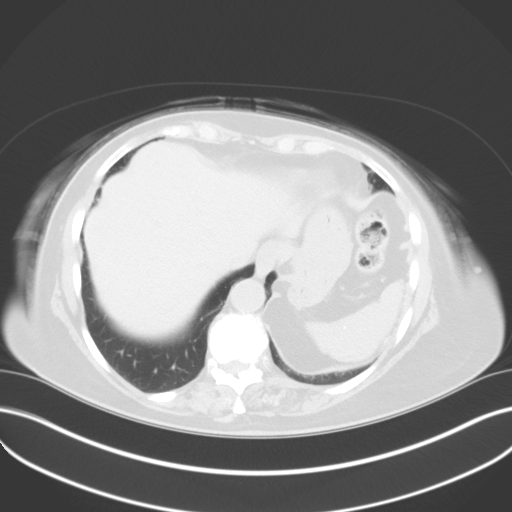
[im 82/86  soft-tissue]
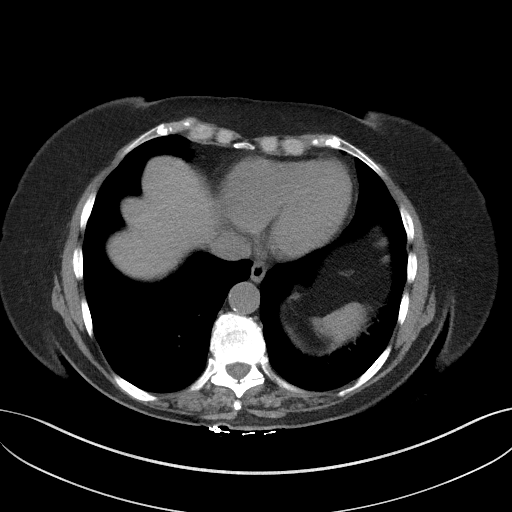
[im 82/86  lung]
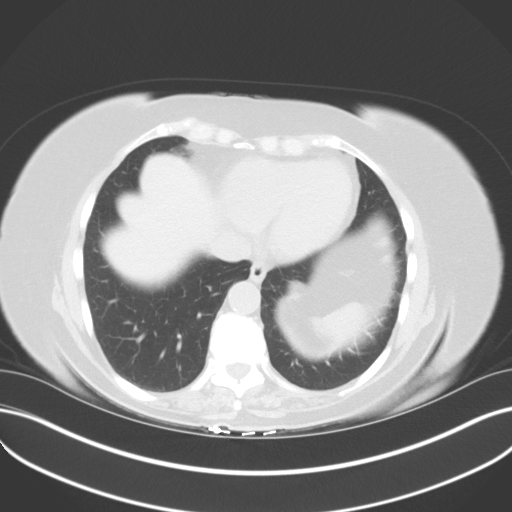

[14 of 32 positions shown; findings below may reference images not displayed]

FINDINGS: Lower chest:  Unremarkable.

Hepatobiliary: No focal abnormality in the liver on this study
without intravenous contrast. There is no evidence for gallstones,
gallbladder wall thickening, or pericholecystic fluid. No
intrahepatic or extrahepatic biliary dilation.

Pancreas: No focal mass lesion. No dilatation of the main duct. No
intraparenchymal cyst. No peripancreatic edema.

Spleen: No splenomegaly. No focal mass lesion.

Adrenals/Urinary Tract: No adrenal nodule or mass. 18 mm water
density lesion interpolar right kidney is compatible with a cyst.
Tiny low-density lesions in the cortex of left kidney are too small
to characterize but likely also represent cysts. No evidence for
stones in either kidney. No ureteral or bladder stones. Gas in the
bladder lumen may be related to recent instrumentation although
bladder infection could cause this appearance.

Stomach/Bowel: Stomach is nondistended. No gastric wall thickening.
No evidence of outlet obstruction. Duodenum is normally positioned
as is the ligament of Treitz. No small bowel wall thickening. No
small bowel dilatation. The terminal ileum is normal. The appendix
is normal. Diverticuli are seen scattered along the entire length of
the colon without CT findings of diverticulitis.

Vascular/Lymphatic: There is abdominal aortic atherosclerosis
without aneurysm. There is no gastrohepatic or hepatoduodenal
ligament lymphadenopathy. No intraperitoneal or retroperitoneal
lymphadenopathy. No pelvic sidewall lymphadenopathy.

Reproductive: Uterus surgically absent.  There is no adnexal mass.

Other: No intraperitoneal free fluid.

Musculoskeletal: Pelvic floor laxity evident. Bone windows reveal no
worrisome lytic or sclerotic osseous lesions.
IMPRESSION: 1. No evidence for urinary stone disease.  No hydroureteronephrosis.
2. Gas in the bladder lumen presumably related instrumentation
recently, but bladder infection could have this appearance.
3. 18 mm water density lesion interpolar right kidney likely a cyst.
Tiny low-density lesions in the left kidney cannot be definitively
characterize but are statistically most likely cysts.
4.  Abdominal Aortic Atherosclerois ([id number]-170.0)

## 2019-03-01 DIAGNOSIS — N390 Urinary tract infection, site not specified: Secondary | ICD-10-CM | POA: Diagnosis not present

## 2019-03-13 DIAGNOSIS — R8290 Unspecified abnormal findings in urine: Secondary | ICD-10-CM | POA: Diagnosis not present

## 2019-03-13 DIAGNOSIS — R350 Frequency of micturition: Secondary | ICD-10-CM | POA: Diagnosis not present

## 2019-03-29 DIAGNOSIS — R300 Dysuria: Secondary | ICD-10-CM | POA: Diagnosis not present

## 2019-03-29 DIAGNOSIS — R350 Frequency of micturition: Secondary | ICD-10-CM | POA: Diagnosis not present

## 2019-04-12 DIAGNOSIS — Z79899 Other long term (current) drug therapy: Secondary | ICD-10-CM | POA: Diagnosis not present

## 2019-04-12 DIAGNOSIS — I10 Essential (primary) hypertension: Secondary | ICD-10-CM | POA: Diagnosis not present

## 2019-04-12 DIAGNOSIS — N390 Urinary tract infection, site not specified: Secondary | ICD-10-CM | POA: Diagnosis not present

## 2019-04-12 DIAGNOSIS — E7800 Pure hypercholesterolemia, unspecified: Secondary | ICD-10-CM | POA: Diagnosis not present

## 2019-06-20 ENCOUNTER — Other Ambulatory Visit: Payer: Self-pay | Admitting: Internal Medicine

## 2019-06-20 DIAGNOSIS — Z1231 Encounter for screening mammogram for malignant neoplasm of breast: Secondary | ICD-10-CM

## 2019-08-02 DIAGNOSIS — M1990 Unspecified osteoarthritis, unspecified site: Secondary | ICD-10-CM | POA: Diagnosis not present

## 2019-08-02 DIAGNOSIS — I10 Essential (primary) hypertension: Secondary | ICD-10-CM | POA: Diagnosis not present

## 2019-08-02 DIAGNOSIS — Z0000 Encounter for general adult medical examination without abnormal findings: Secondary | ICD-10-CM | POA: Diagnosis not present

## 2019-08-02 DIAGNOSIS — E7800 Pure hypercholesterolemia, unspecified: Secondary | ICD-10-CM | POA: Diagnosis not present

## 2019-08-02 DIAGNOSIS — L409 Psoriasis, unspecified: Secondary | ICD-10-CM | POA: Diagnosis not present

## 2019-08-06 ENCOUNTER — Other Ambulatory Visit: Payer: Self-pay

## 2019-08-06 ENCOUNTER — Ambulatory Visit
Admission: RE | Admit: 2019-08-06 | Discharge: 2019-08-06 | Disposition: A | Discharge status: Home / Self Care | Payer: PPO | Source: Ambulatory Visit | Attending: Internal Medicine | Admitting: Internal Medicine

## 2019-08-06 DIAGNOSIS — Z1231 Encounter for screening mammogram for malignant neoplasm of breast: Secondary | ICD-10-CM | POA: Insufficient documentation

## 2019-10-18 DIAGNOSIS — E7800 Pure hypercholesterolemia, unspecified: Secondary | ICD-10-CM | POA: Diagnosis not present

## 2019-10-18 DIAGNOSIS — L409 Psoriasis, unspecified: Secondary | ICD-10-CM | POA: Diagnosis not present

## 2019-10-18 DIAGNOSIS — R8290 Unspecified abnormal findings in urine: Secondary | ICD-10-CM | POA: Diagnosis not present

## 2019-10-18 DIAGNOSIS — I10 Essential (primary) hypertension: Secondary | ICD-10-CM | POA: Diagnosis not present

## 2019-10-18 DIAGNOSIS — M1990 Unspecified osteoarthritis, unspecified site: Secondary | ICD-10-CM | POA: Diagnosis not present

## 2019-10-18 DIAGNOSIS — Z1211 Encounter for screening for malignant neoplasm of colon: Secondary | ICD-10-CM | POA: Diagnosis not present

## 2019-10-18 DIAGNOSIS — Z79899 Other long term (current) drug therapy: Secondary | ICD-10-CM | POA: Diagnosis not present

## 2019-11-01 DIAGNOSIS — Z1211 Encounter for screening for malignant neoplasm of colon: Secondary | ICD-10-CM | POA: Diagnosis not present

## 2019-11-28 DIAGNOSIS — R509 Fever, unspecified: Secondary | ICD-10-CM | POA: Diagnosis not present

## 2019-11-28 DIAGNOSIS — R350 Frequency of micturition: Secondary | ICD-10-CM | POA: Diagnosis not present

## 2019-12-07 DIAGNOSIS — N3001 Acute cystitis with hematuria: Secondary | ICD-10-CM | POA: Diagnosis not present

## 2019-12-07 DIAGNOSIS — R5383 Other fatigue: Secondary | ICD-10-CM | POA: Diagnosis not present

## 2020-03-26 DIAGNOSIS — I10 Essential (primary) hypertension: Secondary | ICD-10-CM | POA: Diagnosis not present

## 2020-03-26 DIAGNOSIS — E7800 Pure hypercholesterolemia, unspecified: Secondary | ICD-10-CM | POA: Diagnosis not present

## 2020-03-26 DIAGNOSIS — N390 Urinary tract infection, site not specified: Secondary | ICD-10-CM | POA: Diagnosis not present

## 2020-03-26 DIAGNOSIS — Z79899 Other long term (current) drug therapy: Secondary | ICD-10-CM | POA: Diagnosis not present

## 2020-06-09 ENCOUNTER — Encounter: Payer: Self-pay | Admitting: Urology

## 2020-06-09 ENCOUNTER — Ambulatory Visit: Payer: PPO | Admitting: Urology

## 2020-06-09 ENCOUNTER — Other Ambulatory Visit: Payer: Self-pay

## 2020-06-09 VITALS — BP 155/94 | HR 60 | Ht 63.0 in | Wt 165.0 lb

## 2020-06-09 DIAGNOSIS — N8111 Cystocele, midline: Secondary | ICD-10-CM | POA: Diagnosis not present

## 2020-06-09 NOTE — Progress Notes (Signed)
06/09/2020 1:23 PM   Holly Robbins 06/20/1941 [id number]  Referring provider: Marguarite Arbour, MD [address] [facility] [place],  [place] [address]  Chief Complaint  Patient presents with  . Hematuria    HPI: Consulted to assist the patient's prolapse symptoms.  She feels a vaginal bulge and sometimes reduces it.  Sometimes she holds up the bladder if she had to strain for a bowel movement.  Normally she has regular bowel movements when she takes her laxative.  Her discomfort primarily is in the lower abdomen and suprapubic area and much less so in the vagina.  She has had a hysterectomy.  She does not report vaginal dryness  She initially in the morning has good flow for many seconds.  Then she stops and starts and double voids 3 or 4 times a reasonable amount until she feels empty.  She voids every 1 or 2 hours gets up twice at night and is continent  She has had a stroke and is on daily aspirin and Plavix    PMH: Past Medical History:  Diagnosis Date  . Arthritis   . GERD (gastroesophageal reflux disease)   . History of hiatal hernia   . Hypertension   . Spinal headache 1956   block for delivery of son    Surgical History: Past Surgical History:  Procedure Laterality Date  . ABDOMINAL HYSTERECTOMY    . TOTAL KNEE ARTHROPLASTY Right 02/08/2017   Procedure: TOTAL KNEE ARTHROPLASTY;  Surgeon: Kennedy Bucker, MD;  Location: [facility] ORS;  Service: Orthopedics;  Laterality: Right;  . TUBAL LIGATION      Home Medications:  Allergies as of 06/09/2020      Reactions   Adhesive [tape] Other (See Comments)   Blistering (PAPER TAPE IS OKAY)   Atorvastatin    Other reaction(s): Muscle Pain   Contrast Media [iodinated Diagnostic Agents] Hives   Eggs Or Egg-derived Products Nausea Only   Severe abdominal pain   Erythromycin Hives   Latex Other (See Comments)   Blisters on skin   Other Hives, Nausea Only   Xray dye eggs Xray dye eggs Severe  abdominal pain   Pedi-pre Tape Spray [wound Dressing Adhesive] Other (See Comments)   Blistering (PAPER TAPE IS OKAY)   Penicillins Hives   Has patient had a PCN reaction causing immediate rash, facial/tongue/throat swelling, SOB or lightheadedness with hypotension:No Has patient had a PCN reaction causing severe rash involving mucus membranes or skin necrosis:Yes Has patient had a PCN reaction that required hospitalization:No Has patient had a PCN reaction occurring within the last 10 years:No If all of the above answers are "NO", then may proceed with Cephalosporin use.      Medication List       Accurate as of June 09, 2020  1:23 PM. If you have any questions, ask your nurse or doctor.        acetaminophen 500 MG tablet Commonly known as: TYLENOL Take by mouth.   amLODipine 5 MG tablet Commonly known as: NORVASC Take 5 mg by mouth daily.   aspirin EC 81 MG tablet Take 81 mg by mouth daily.   B-complex with vitamin C tablet Take 1 tablet by mouth daily.   BIOTIN PO Take 1 tablet by mouth daily.   Calcium 600 600 MG Tabs tablet Generic drug: calcium carbonate Take 600 mg by mouth 2 (two) times daily.   clopidogrel 75 MG tablet Commonly known as: PLAVIX Take 75 mg by mouth daily.  CRANBERRY CONCENTRATE PO Take 25,000 mg by mouth every morning.   diazepam 5 MG tablet Commonly known as: VALIUM Take 5 mg by mouth 2 (two) times daily as needed. For anxiety/sleep.   diclofenac 75 MG EC tablet Commonly known as: VOLTAREN Take 75 mg by mouth 2 (two) times daily as needed (for pain (scheduled in the morning)).   dicyclomine 20 MG tablet Commonly known as: BENTYL Take 20 mg by mouth See admin instructions. Take 1 tablet (20 mg) by mouth scheduled every morning, may repeat dose if needed for IBS   enoxaparin 30 MG/0.3ML injection Commonly known as: LOVENOX Inject 0.3 mLs (30 mg total) into the skin every 12 (twelve) hours.   FISH OIL PO Take 1 capsule by  mouth at bedtime.   GLUCOSAMINE CHONDR 1500 COMPLX PO Take 1 tablet by mouth every morning.   L-Lysine 500 MG Caps Take 1 capsule by mouth every morning.   lisinopril-hydrochlorothiazide 20-12.5 MG tablet Commonly known as: ZESTORETIC Take 0.5 tablets by mouth daily.   MAGNESIUM PO Take 1 tablet by mouth daily.   metroNIDAZOLE 500 MG tablet Commonly known as: FLAGYL Take 500 mg by mouth 3 (three) times daily.   multivitamin with minerals Tabs tablet Take 1 tablet by mouth daily.   omeprazole 40 MG capsule Commonly known as: PRILOSEC Take 40 mg by mouth daily.   oxyCODONE 5 MG immediate release tablet Commonly known as: Oxy IR/ROXICODONE Take 1-2 tablets (5-10 mg total) by mouth every 4 (four) hours as needed for breakthrough pain.   pravastatin 40 MG tablet Commonly known as: PRAVACHOL Take 1 tablet by mouth at bedtime. What changed: Another medication with the same name was removed. Continue taking this medication, and follow the directions you see here. Changed by: Martina Sinner, MD   PROBIOTIC COLON SUPPORT PO Take 2 capsules by mouth 2 (two) times daily.   psyllium 58.6 % packet Commonly known as: METAMUCIL Take by mouth. What changed: Another medication with the same name was removed. Continue taking this medication, and follow the directions you see here. Changed by: Martina Sinner, MD   PX Complete Senior Multivits Tabs Take by mouth.   pyridOXINE 100 MG tablet Commonly known as: VITAMIN B-6 Take 100 mg by mouth daily.   ranitidine 300 MG capsule Commonly known as: ZANTAC Take 300 mg by mouth daily at 8 pm.   traZODone 50 MG tablet Commonly known as: DESYREL Take by mouth.   Vitamin B-12 CR 1500 MCG Tbcr Take 1,500 mcg by mouth daily.   Vitamin D3 125 MCG (5000 UT) Caps Take 1 capsule by mouth every morning.   vitamin E 1000 UNIT capsule Take 1,000 Units by mouth daily.       Allergies:  Allergies  Allergen Reactions  .  Adhesive [Tape] Other (See Comments)    Blistering (PAPER TAPE IS OKAY)  . Atorvastatin     Other reaction(s): Muscle Pain  . Contrast Media [Iodinated Diagnostic Agents] Hives  . Eggs Or Egg-Derived Products Nausea Only    Severe abdominal pain  . Erythromycin Hives  . Latex Other (See Comments)    Blisters on skin  . Other Hives and Nausea Only    Xray dye eggs Xray dye eggs Severe abdominal pain  . Pedi-Pre Tape Spray [Wound Dressing Adhesive] Other (See Comments)    Blistering (PAPER TAPE IS OKAY)  . Penicillins Hives    Has patient had a PCN reaction causing immediate rash, facial/tongue/throat swelling, SOB or lightheadedness  with hypotension:No Has patient had a PCN reaction causing severe rash involving mucus membranes or skin necrosis:Yes Has patient had a PCN reaction that required hospitalization:No Has patient had a PCN reaction occurring within the last 10 years:No If all of the above answers are "NO", then may proceed with Cephalosporin use.     Family History: Family History  Problem Relation Age of Onset  . Chronic Renal Failure Father   . Prostate cancer Father   . Hematuria Sister   . Chronic Renal Failure Son   . Breast cancer Neg Hx     Social History:  reports that she quit smoking about 29 years ago. She has never used smokeless tobacco. She reports that she does not drink alcohol and does not use drugs.  ROS:                                        Physical Exam: BP (!) 155/94   Pulse 60   Ht 5\' 3"  (1.6 m)   Wt 165 lb (74.8 kg)   BMI 29.23 kg/m   Constitutional:  Alert and oriented, No acute distress. HEENT: [place] AT, moist mucus membranes.  Trachea midline, no masses. Cardiovascular: No clubbing, cyanosis, or edema. Respiratory: Normal respiratory effort, no increased work of breathing. GI: Abdomen is soft, nontender, nondistended, no abdominal masses GU: On pelvic examination she had a grade 3 cystocele with central  defect.  Her vaginal cuff with the centimeter 9 cm to 4 5 cm.  The vaginal epithelium has significant atrophy and was very smooth.  When it was support the apex anteriorly she had no rectocele.  She had no stress incontinence with minimal hypermobility the bladder neck. Skin: No rashes, bruises or suspicious lesions. Lymph: No cervical or inguinal adenopathy. Neurologic: Grossly intact, no focal deficits, moving all 4 extremities. Psychiatric: Normal mood and affect.  Laboratory Data: Lab Results  Component Value Date   WBC 12.4 (H) 03/07/2017   HGB 11.7 (L) 03/07/2017   HCT 34.9 (L) 03/07/2017   MCV 89.6 03/07/2017   PLT 434 03/07/2017    Lab Results  Component Value Date   CREATININE 0.91 03/07/2017    No results found for: PSA  No results found for: TESTOSTERONE  No results found for: HGBA1C  Urinalysis    Component Value Date/Time   COLORURINE YELLOW (A) 02/10/2017 1338   APPEARANCEUR HAZY (A) 02/10/2017 1338   APPEARANCEUR Clear 10/13/2016 1417   LABSPEC 1.010 02/10/2017 1338   PHURINE 7.0 02/10/2017 1338   GLUCOSEU NEGATIVE 02/10/2017 1338   HGBUR LARGE (A) 02/10/2017 1338   BILIRUBINUR NEGATIVE 02/10/2017 1338   BILIRUBINUR Negative 10/13/2016 1417   KETONESUR NEGATIVE 02/10/2017 1338   PROTEINUR NEGATIVE 02/10/2017 1338   NITRITE NEGATIVE 02/10/2017 1338   LEUKOCYTESUR SMALL (A) 02/10/2017 1338   LEUKOCYTESUR Negative 10/13/2016 1417    Pertinent Imaging: Chart reviewed.  Urine reviewed.  Urine sent for culture.  Residual urine volume 11 mL  Assessment & Plan: Patient has prolapse with prolapse symptoms.  She has some medical comorbidities and has had a stroke and is on blood thinners.  Suprapubic discomfort may not be secondary to prolapse.  She was cleared a few years ago for microscopic hematuria and is allergic to contrast.  Picture was drawn.  If the patient ever had surgery a transvaginal vault suspension with cystocele repair and graft would be  recommended.  The anterior defect was quite voluminous and smooth and I wonder if she has an apical enterocele as well.  She probably does not.  Robotic sacrocolpopexy is another option but I do believe a transvaginal approach is also appropriate.  I would like to put her on vaginal estrogen cream to see if it settles down some of the symptoms.  I will call if urine culture is positive.  I want her come back for cystoscopy and I would try to find a wider speculum and repeat examination.  I want to make certain that the sacrospinous would support the anterior and apical defect.  Role of urodynamics discussed.  Does not want surgery.  She might consider a pessary and estrogen prior but I think she just cannot live with it.  For safety reasons she will come back in a few weeks for cystoscopy.  She will give a urine sample at her primary care on Friday and wrote down the cord culture so they will sign 1.  Otherwise I will get another 1 when I scoped her.  There are no diagnoses linked to this encounter.  No follow-ups on file.  Martina Sinner, MD  [facility] [address] [place], [place] [address] [phone]

## 2020-06-09 NOTE — Patient Instructions (Signed)
Cystoscopy Cystoscopy is a procedure that is used to help diagnose and sometimes treat conditions that affect the lower urinary tract. The lower urinary tract includes the bladder and the urethra. The urethra is the tube that drains urine from the bladder. Cystoscopy is done using a thin, tube-shaped instrument with a light and camera at the end (cystoscope). The cystoscope may be hard or flexible, depending on the goal of the procedure. The cystoscope is inserted through the urethra, into the bladder. Cystoscopy may be recommended if you have:  Urinary tract infections that keep coming back.  Blood in the urine (hematuria).  An inability to control when you urinate (urinary incontinence) or an overactive bladder.  Unusual cells found in a urine sample.  A blockage in the urethra, such as a urinary stone.  Painful urination.  An abnormality in the bladder found during an intravenous pyelogram (IVP) or CT scan. Cystoscopy may also be done to remove a sample of tissue to be examined under a microscope (biopsy). What are the risks? Generally, this is a safe procedure. However, problems may occur, including:  Infection.  Bleeding.  What happens during the procedure?  1. You will be given one or more of the following: ? A medicine to numb the area (local anesthetic). 2. The area around the opening of your urethra will be cleaned. 3. The cystoscope will be passed through your urethra into your bladder. 4. Germ-free (sterile) fluid will flow through the cystoscope to fill your bladder. The fluid will stretch your bladder so that your health care provider can clearly examine your bladder walls. 5. Your doctor will look at the urethra and bladder. 6. The cystoscope will be removed The procedure may vary among health care providers  What can I expect after the procedure? After the procedure, it is common to have: 1. Some soreness or pain in your abdomen and urethra. 2. Urinary symptoms.  These include: ? Mild pain or burning when you urinate. Pain should stop within a few minutes after you urinate. This may last for up to 1 week. ? A small amount of blood in your urine for several days. ? Feeling like you need to urinate but producing only a small amount of urine. Follow these instructions at home: General instructions  Return to your normal activities as told by your health care provider.   Do not drive for 24 hours if you were given a sedative during your procedure.  Watch for any blood in your urine. If the amount of blood in your urine increases, call your health care provider.  If a tissue sample was removed for testing (biopsy) during your procedure, it is up to you to get your test results. Ask your health care provider, or the department that is doing the test, when your results will be ready.  Drink enough fluid to keep your urine pale yellow.  Keep all follow-up visits as told by your health care provider. This is important. Contact a health care provider if you:  Have pain that gets worse or does not get better with medicine, especially pain when you urinate.  Have trouble urinating.  Have more blood in your urine. Get help right away if you:  Have blood clots in your urine.  Have abdominal pain.  Have a fever or chills.  Are unable to urinate. Summary  Cystoscopy is a procedure that is used to help diagnose and sometimes treat conditions that affect the lower urinary tract.  Cystoscopy is done using   a thin, tube-shaped instrument with a light and camera at the end.  After the procedure, it is common to have some soreness or pain in your abdomen and urethra.  Watch for any blood in your urine. If the amount of blood in your urine increases, call your health care provider.  If you were prescribed an antibiotic medicine, take it as told by your health care provider. Do not stop taking the antibiotic even if you start to feel better. This  information is not intended to replace advice given to you by your health care provider. Make sure you discuss any questions you have with your health care provider. Document Revised: 07/04/2018 Document Reviewed: 07/04/2018 [employer] Patient Education  2020 [employer]   

## 2020-06-13 DIAGNOSIS — E7800 Pure hypercholesterolemia, unspecified: Secondary | ICD-10-CM | POA: Diagnosis not present

## 2020-06-13 DIAGNOSIS — I10 Essential (primary) hypertension: Secondary | ICD-10-CM | POA: Diagnosis not present

## 2020-06-13 DIAGNOSIS — N390 Urinary tract infection, site not specified: Secondary | ICD-10-CM | POA: Diagnosis not present

## 2020-06-13 DIAGNOSIS — R8290 Unspecified abnormal findings in urine: Secondary | ICD-10-CM | POA: Diagnosis not present

## 2020-06-13 DIAGNOSIS — Z79899 Other long term (current) drug therapy: Secondary | ICD-10-CM | POA: Diagnosis not present

## 2020-06-27 DIAGNOSIS — E7800 Pure hypercholesterolemia, unspecified: Secondary | ICD-10-CM | POA: Diagnosis not present

## 2020-06-27 DIAGNOSIS — N390 Urinary tract infection, site not specified: Secondary | ICD-10-CM | POA: Diagnosis not present

## 2020-06-27 DIAGNOSIS — K589 Irritable bowel syndrome without diarrhea: Secondary | ICD-10-CM | POA: Diagnosis not present

## 2020-06-27 DIAGNOSIS — M1990 Unspecified osteoarthritis, unspecified site: Secondary | ICD-10-CM | POA: Diagnosis not present

## 2020-06-27 DIAGNOSIS — I10 Essential (primary) hypertension: Secondary | ICD-10-CM | POA: Diagnosis not present

## 2020-06-27 DIAGNOSIS — Z1231 Encounter for screening mammogram for malignant neoplasm of breast: Secondary | ICD-10-CM | POA: Diagnosis not present

## 2020-06-27 DIAGNOSIS — L409 Psoriasis, unspecified: Secondary | ICD-10-CM | POA: Diagnosis not present

## 2020-06-30 ENCOUNTER — Ambulatory Visit: Payer: PPO | Admitting: Urology

## 2020-06-30 ENCOUNTER — Encounter: Payer: Self-pay | Admitting: Urology

## 2020-06-30 ENCOUNTER — Other Ambulatory Visit: Payer: Self-pay | Admitting: Internal Medicine

## 2020-06-30 ENCOUNTER — Other Ambulatory Visit: Payer: Self-pay

## 2020-06-30 VITALS — BP 141/86 | HR 64

## 2020-06-30 DIAGNOSIS — N8111 Cystocele, midline: Secondary | ICD-10-CM

## 2020-06-30 DIAGNOSIS — Z1231 Encounter for screening mammogram for malignant neoplasm of breast: Secondary | ICD-10-CM

## 2020-06-30 DIAGNOSIS — N3946 Mixed incontinence: Secondary | ICD-10-CM

## 2020-06-30 LAB — MICROSCOPIC EXAMINATION: Bacteria, UA: NONE SEEN

## 2020-06-30 LAB — URINALYSIS, COMPLETE
Bilirubin, UA: NEGATIVE
Glucose, UA: NEGATIVE
Ketones, UA: NEGATIVE
Nitrite, UA: NEGATIVE
Protein,UA: NEGATIVE
Specific Gravity, UA: 1.01 (ref 1.005–1.030)
Urobilinogen, Ur: 0.2 mg/dL (ref 0.2–1.0)
pH, UA: 6 (ref 5.0–7.5)

## 2020-06-30 NOTE — Progress Notes (Signed)
06/30/2020 1:36 PM   Holly Robbins 23-Nov-1940 [id number]  Referring provider: Marguarite ArbourSparks, Jeffrey D, MD [address] [place] [facility] [place] [id number]  Chief Complaint  Patient presents with  . Cysto    HPI: Consulted to assist the patient's prolapse symptoms.  She feels a vaginal bulge and sometimes reduces it.  Sometimes she holds up the bladder if she had to strain for a bowel movement.  Normally she has regular bowel movements when she takes her laxative.  Her discomfort primarily is in the lower abdomen and suprapubic area and much less so in the vagina.  She has had a hysterectomy.  She does not report vaginal dryness  She initially in the morning has good flow for many seconds.  Then she stops and starts and double voids 3 or 4 times a reasonable amount until she feels empty.  She voids every 1 or 2 hours gets up twice at night and is continent  She has had a stroke and is on daily aspirin and Plavix  On pelvic examination she had a grade 3 cystocele with central defect.  Her vaginal cuff with the centimeter 9 cm to 4 5 cm.  The vaginal epithelium has significant atrophy and was very smooth.  When it was support the apex anteriorly she had no rectocele.  She had no stress incontinence with minimal hypermobility the bladder neck.  Patient has prolapse with prolapse symptoms.  She has some medical comorbidities and has had a stroke and is on blood thinners.  Suprapubic discomfort may not be secondary to prolapse.  She was cleared a few years ago for microscopic hematuria and is allergic to contrast.  Picture was drawn.  If the patient ever had surgery a transvaginal vault suspension with cystocele repair and graft would be recommended.  The anterior defect was quite voluminous and smooth and I wonder if she has an apical enterocele as well.  She probably does not.  Robotic sacrocolpopexy is another option but I do believe a transvaginal approach is also  appropriate.  I would like to put her on vaginal estrogen cream to see if it settles down some of the symptoms.  I will call if urine culture is positive.  I want her come back for cystoscopy and I would try to find a wider speculum and repeat examination.  I want to make certain that the sacrospinous would support the anterior and apical defect.  Role of urodynamics discussed.  Does not want surgery.  She might consider a pessary and estrogen prior but I think she just cannot live with it.  For safety reasons she will come back in a few weeks for cystoscopy.  She will give a urine sample at her primary care on Friday and wrote down the cord culture so they will sign 1.  Otherwise I will get another 1 when I scoped her.  Today Frequency stable.  Prolapse stable. Examination noted significant atrophy. She describes being treated for a positive culture at least a positive urinalysis with no change in symptoms in the last week.  She just finished ciprofloxacin Cystoscopy: Patient underwent flexible cystoscopy using sterile technique.  Bladder close and trigone were normal.  No stitch foreign body or carcinoma.  Urethra was a little bit friable.  Reassurance given    PMH: Past Medical History:  Diagnosis Date  . Arthritis   . GERD (gastroesophageal reflux disease)   . History of hiatal hernia   . Hypertension   .  Spinal headache 1956   block for delivery of son    Surgical History: Past Surgical History:  Procedure Laterality Date  . ABDOMINAL HYSTERECTOMY    . TOTAL KNEE ARTHROPLASTY Right 02/08/2017   Procedure: TOTAL KNEE ARTHROPLASTY;  Surgeon: Kennedy Bucker, MD;  Location: [facility] ORS;  Service: Orthopedics;  Laterality: Right;  . TUBAL LIGATION      Home Medications:  Allergies as of 06/30/2020      Reactions   Adhesive [tape] Other (See Comments)   Blistering (PAPER TAPE IS OKAY)   Atorvastatin    Other reaction(s): Muscle Pain   Contrast Media [iodinated Diagnostic Agents]  Hives   Eggs Or Egg-derived Products Nausea Only   Severe abdominal pain   Erythromycin Hives   Latex Other (See Comments)   Blisters on skin   Other Hives, Nausea Only   Xray dye eggs Xray dye eggs Severe abdominal pain   Pedi-pre Tape Spray [wound Dressing Adhesive] Other (See Comments)   Blistering (PAPER TAPE IS OKAY)   Penicillins Hives   Has patient had a PCN reaction causing immediate rash, facial/tongue/throat swelling, SOB or lightheadedness with hypotension:No Has patient had a PCN reaction causing severe rash involving mucus membranes or skin necrosis:Yes Has patient had a PCN reaction that required hospitalization:No Has patient had a PCN reaction occurring within the last 10 years:No If all of the above answers are "NO", then may proceed with Cephalosporin use.      Medication List       Accurate as of June 30, 2020  1:36 PM. If you have any questions, ask your nurse or doctor.        STOP taking these medications   metroNIDAZOLE 500 MG tablet Commonly known as: FLAGYL Stopped by: Martina Sinner, MD   omeprazole 40 MG capsule Commonly known as: PRILOSEC Stopped by: Martina Sinner, MD   oxyCODONE 5 MG immediate release tablet Commonly known as: Oxy IR/ROXICODONE Stopped by: Martina Sinner, MD   traZODone 50 MG tablet Commonly known as: DESYREL Stopped by: Martina Sinner, MD     TAKE these medications   acetaminophen 500 MG tablet Commonly known as: TYLENOL Take by mouth.   amLODipine 5 MG tablet Commonly known as: NORVASC Take 5 mg by mouth daily.   aspirin EC 81 MG tablet Take 81 mg by mouth daily.   B-complex with vitamin C tablet Take 1 tablet by mouth daily.   BIOTIN PO Take 1 tablet by mouth daily.   Calcium 600 600 MG Tabs tablet Generic drug: calcium carbonate Take 600 mg by mouth 2 (two) times daily.   clopidogrel 75 MG tablet Commonly known as: PLAVIX Take 75 mg by mouth daily.   CRANBERRY CONCENTRATE PO  Take 25,000 mg by mouth every morning.   diazepam 5 MG tablet Commonly known as: VALIUM Take 5 mg by mouth 2 (two) times daily as needed. For anxiety/sleep.   diclofenac 75 MG EC tablet Commonly known as: VOLTAREN Take 75 mg by mouth 2 (two) times daily as needed (for pain (scheduled in the morning)).   dicyclomine 20 MG tablet Commonly known as: BENTYL Take 20 mg by mouth See admin instructions. Take 1 tablet (20 mg) by mouth scheduled every morning, may repeat dose if needed for IBS   enoxaparin 30 MG/0.3ML injection Commonly known as: LOVENOX Inject 0.3 mLs (30 mg total) into the skin every 12 (twelve) hours.   FISH OIL PO Take 1 capsule by mouth at bedtime.  gabapentin 300 MG capsule Commonly known as: NEURONTIN Take by mouth.   GLUCOSAMINE CHONDR 1500 COMPLX PO Take 1 tablet by mouth every morning.   L-Lysine 500 MG Caps Take 1 capsule by mouth every morning.   lisinopril-hydrochlorothiazide 20-12.5 MG tablet Commonly known as: ZESTORETIC Take 0.5 tablets by mouth daily.   MAGNESIUM PO Take 1 tablet by mouth daily.   multivitamin with minerals Tabs tablet Take 1 tablet by mouth daily.   pravastatin 40 MG tablet Commonly known as: PRAVACHOL Take 1 tablet by mouth at bedtime.   PROBIOTIC COLON SUPPORT PO Take 2 capsules by mouth 2 (two) times daily.   psyllium 58.6 % packet Commonly known as: METAMUCIL Take by mouth.   PX Complete Senior Multivits Tabs Take by mouth.   pyridOXINE 100 MG tablet Commonly known as: VITAMIN B-6 Take 100 mg by mouth daily.   ranitidine 300 MG capsule Commonly known as: ZANTAC Take 300 mg by mouth daily at 8 pm.   Vitamin B-12 CR 1500 MCG Tbcr Take 1,500 mcg by mouth daily.   Vitamin D3 125 MCG (5000 UT) Caps Take 1 capsule by mouth every morning.   vitamin E 1000 UNIT capsule Take 1,000 Units by mouth daily.       Allergies:  Allergies  Allergen Reactions  . Adhesive [Tape] Other (See Comments)     Blistering (PAPER TAPE IS OKAY)  . Atorvastatin     Other reaction(s): Muscle Pain  . Contrast Media [Iodinated Diagnostic Agents] Hives  . Eggs Or Egg-Derived Products Nausea Only    Severe abdominal pain  . Erythromycin Hives  . Latex Other (See Comments)    Blisters on skin  . Other Hives and Nausea Only    Xray dye eggs Xray dye eggs Severe abdominal pain  . Pedi-Pre Tape Spray [Wound Dressing Adhesive] Other (See Comments)    Blistering (PAPER TAPE IS OKAY)  . Penicillins Hives    Has patient had a PCN reaction causing immediate rash, facial/tongue/throat swelling, SOB or lightheadedness with hypotension:No Has patient had a PCN reaction causing severe rash involving mucus membranes or skin necrosis:Yes Has patient had a PCN reaction that required hospitalization:No Has patient had a PCN reaction occurring within the last 10 years:No If all of the above answers are "NO", then may proceed with Cephalosporin use.     Family History: Family History  Problem Relation Age of Onset  . Chronic Renal Failure Father   . Prostate cancer Father   . Hematuria Sister   . Chronic Renal Failure Son   . Breast cancer Neg Hx     Social History:  reports that she quit smoking about 29 years ago. She has never used smokeless tobacco. She reports that she does not drink alcohol and does not use drugs.  ROS:                                        Physical Exam: BP (!) 141/86   Pulse 64   Constitutional:  Alert and oriented, No acute distress. HEENT: [place] AT, moist mucus membranes.  Trachea midline, no masses.  Laboratory Data: Lab Results  Component Value Date   WBC 12.4 (H) 03/07/2017   HGB 11.7 (L) 03/07/2017   HCT 34.9 (L) 03/07/2017   MCV 89.6 03/07/2017   PLT 434 03/07/2017    Lab Results  Component Value Date   CREATININE 0.91  03/07/2017    No results found for: PSA  No results found for: TESTOSTERONE  No results found for: HGBA1C   Urinalysis    Component Value Date/Time   COLORURINE YELLOW (A) 02/10/2017 1338   APPEARANCEUR HAZY (A) 02/10/2017 1338   APPEARANCEUR Clear 10/13/2016 1417   LABSPEC 1.010 02/10/2017 1338   PHURINE 7.0 02/10/2017 1338   GLUCOSEU NEGATIVE 02/10/2017 1338   HGBUR LARGE (A) 02/10/2017 1338   BILIRUBINUR NEGATIVE 02/10/2017 1338   BILIRUBINUR Negative 10/13/2016 1417   KETONESUR NEGATIVE 02/10/2017 1338   PROTEINUR NEGATIVE 02/10/2017 1338   NITRITE NEGATIVE 02/10/2017 1338   LEUKOCYTESUR SMALL (A) 02/10/2017 1338   LEUKOCYTESUR Negative 10/13/2016 1417    Pertinent Imaging:   Assessment & Plan: Reassurance given.  I will see patient as needed.  We talked about the work-up which is normal for microscopic hematuria in her.  She has had a normal work-up and we talked about recurrent bladder infections.  I am not convinced that she gets for 5 bladder infections a year but her family or friend thought that she might.  She does not want to go on prophylaxis yet but we talked about this.  Send urine for culture and see in a year and as needed  1. Cystocele, midline - Urinalysis, Complete   No follow-ups on file.  Martina Sinner, MD  [facility] [address] [place], [place] [address] [phone]

## 2020-07-03 LAB — CULTURE, URINE COMPREHENSIVE

## 2020-08-06 ENCOUNTER — Other Ambulatory Visit: Payer: Self-pay

## 2020-08-06 ENCOUNTER — Ambulatory Visit
Admission: RE | Admit: 2020-08-06 | Discharge: 2020-08-06 | Disposition: A | Discharge status: Home / Self Care | Payer: PPO | Source: Ambulatory Visit | Attending: Internal Medicine | Admitting: Internal Medicine

## 2020-08-06 DIAGNOSIS — Z1231 Encounter for screening mammogram for malignant neoplasm of breast: Secondary | ICD-10-CM | POA: Insufficient documentation

## 2020-09-29 DIAGNOSIS — N390 Urinary tract infection, site not specified: Secondary | ICD-10-CM | POA: Diagnosis not present

## 2020-09-29 DIAGNOSIS — I10 Essential (primary) hypertension: Secondary | ICD-10-CM | POA: Diagnosis not present

## 2020-09-29 DIAGNOSIS — E782 Mixed hyperlipidemia: Secondary | ICD-10-CM | POA: Diagnosis not present

## 2020-09-29 DIAGNOSIS — K582 Mixed irritable bowel syndrome: Secondary | ICD-10-CM | POA: Diagnosis not present

## 2020-09-29 DIAGNOSIS — Z0000 Encounter for general adult medical examination without abnormal findings: Secondary | ICD-10-CM | POA: Diagnosis not present

## 2020-09-29 DIAGNOSIS — Z79899 Other long term (current) drug therapy: Secondary | ICD-10-CM | POA: Diagnosis not present

## 2020-11-05 IMAGING — MG DIGITAL SCREENING BILATERAL MAMMOGRAM WITH TOMO AND CAD
6 of 10 series · 6 of 30 positions shown · non-contrast
Comparison: Previous exam(s).

ACR Breast Density Category a: The breast tissue is almost entirely
fatty.

CLINICAL DATA: Screening.

EXAM:
DIGITAL SCREENING BILATERAL MAMMOGRAM WITH TOMO AND CAD

[L CC synth-2D]
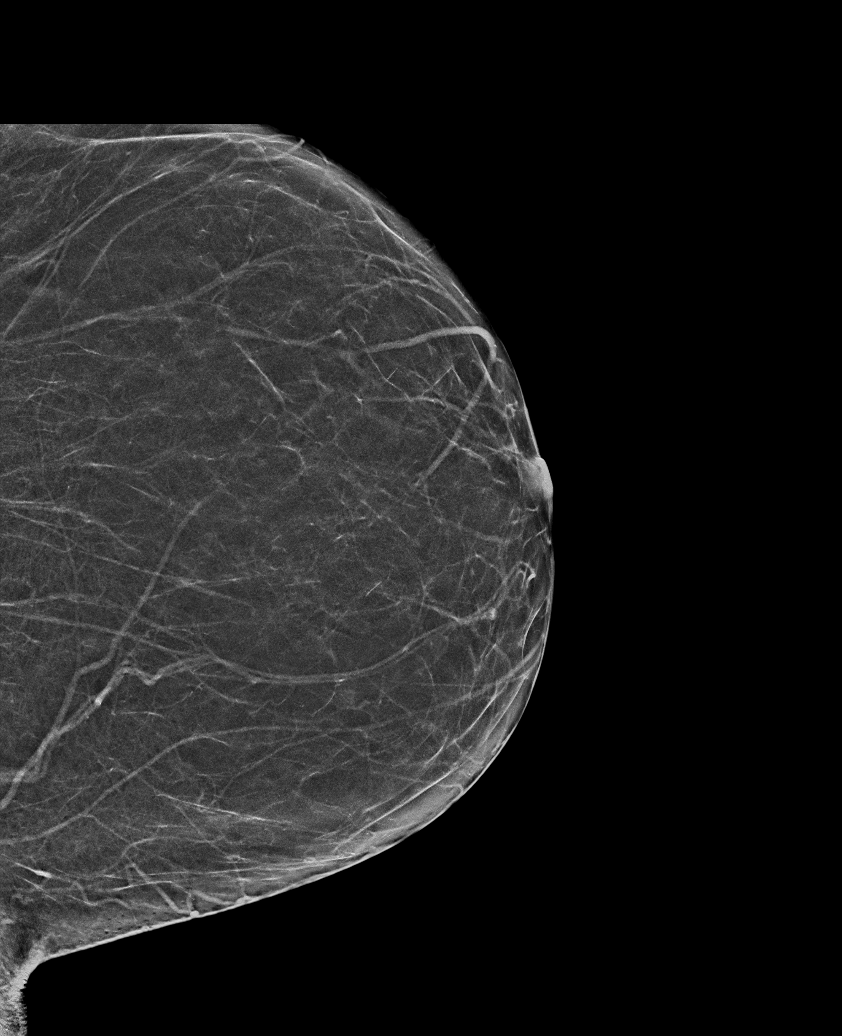

[R CC synth-2D (1 of 2)]
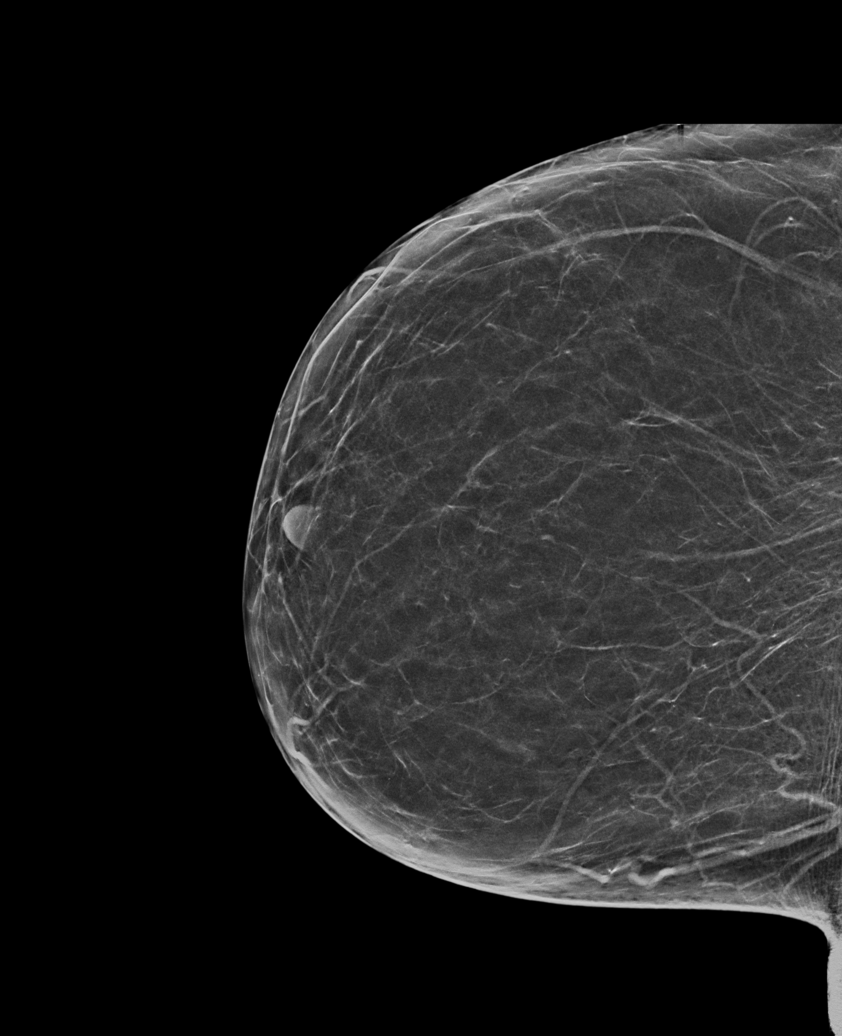

[R MLO synth-2D]
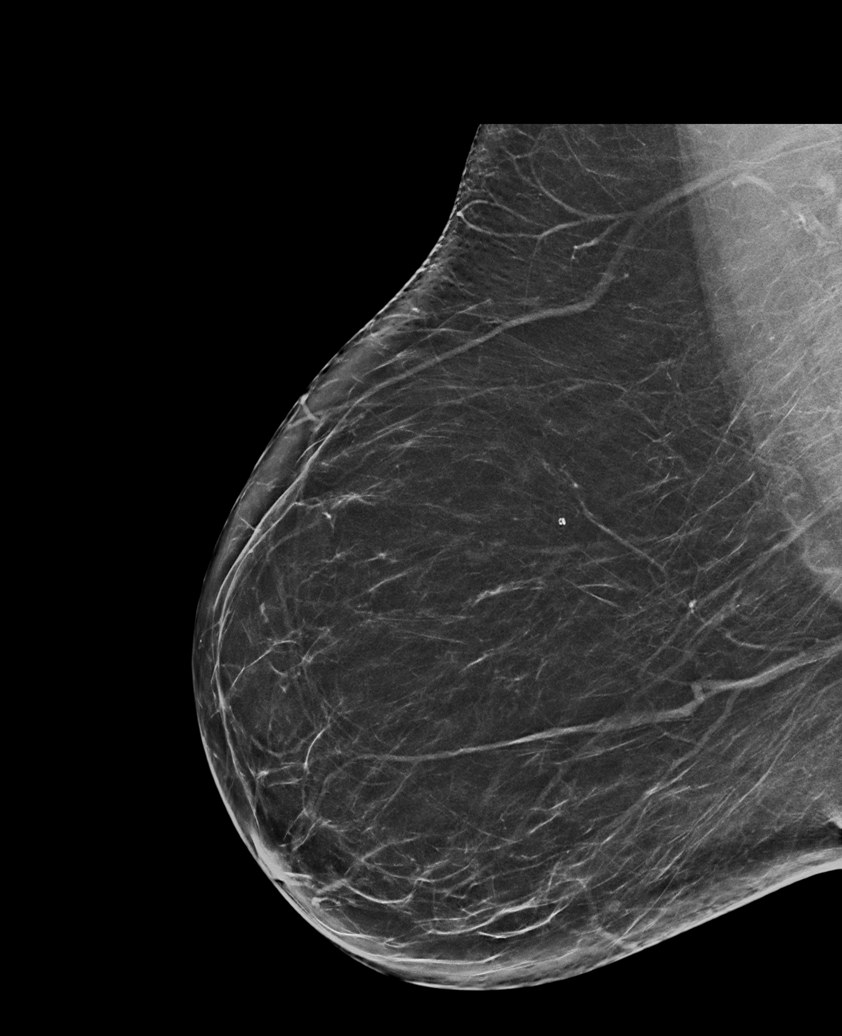

[R CC synth-2D (2 of 2)]
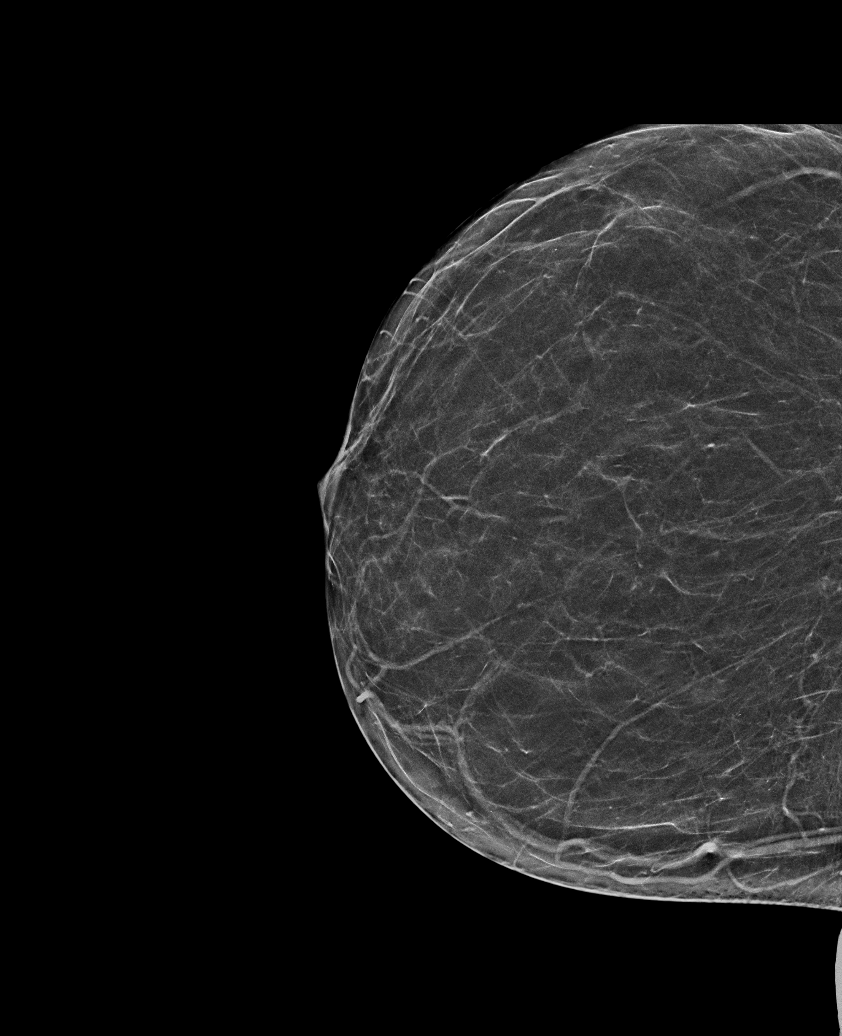

[L MLO synth-2D]
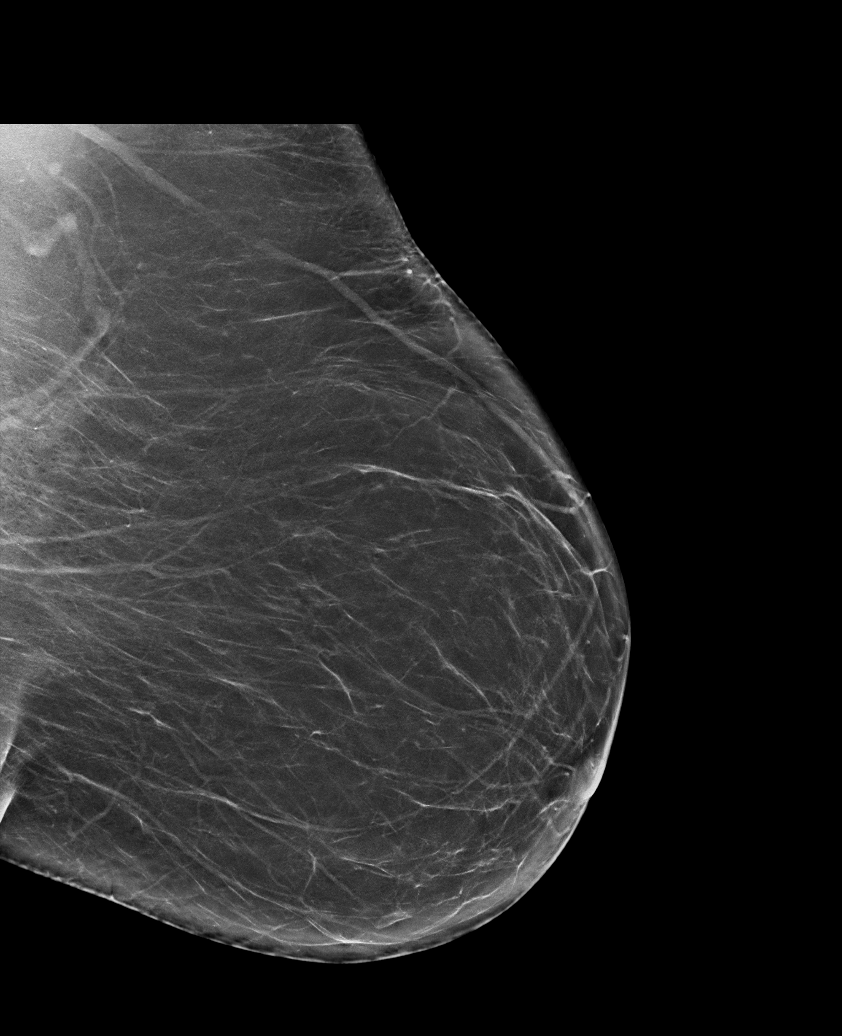

[R MLO tomo · tomo slice 38/75.0]
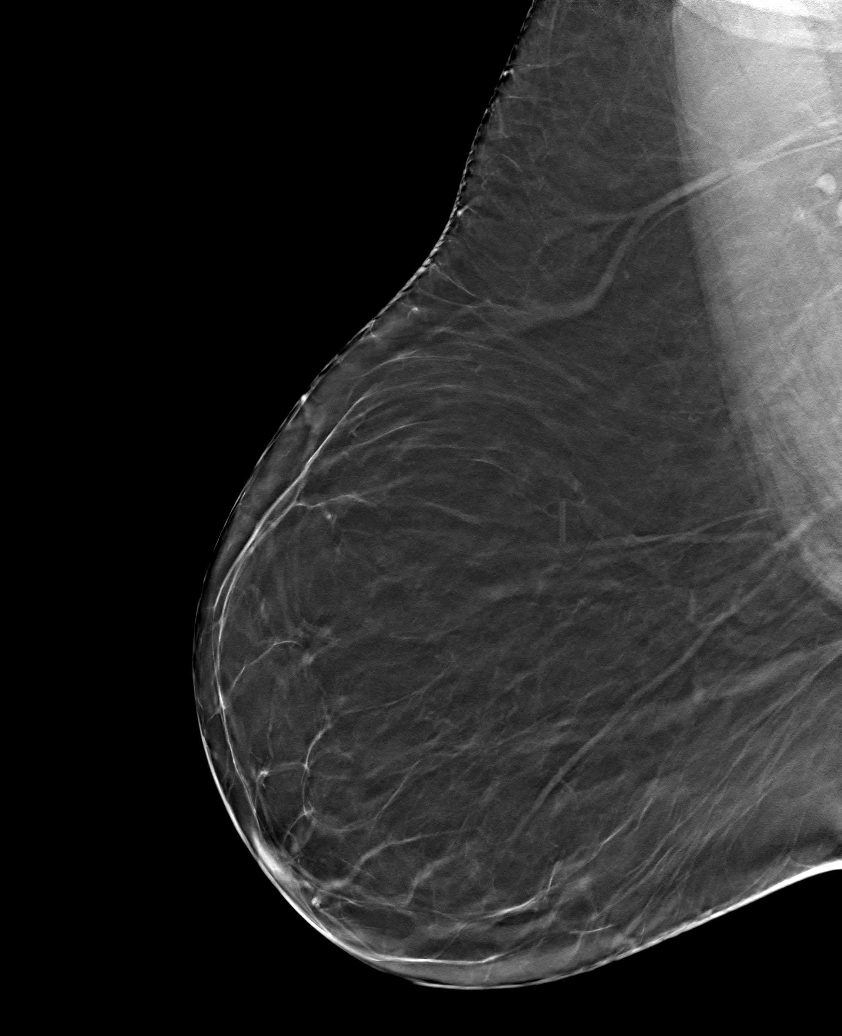

[6 of 30 positions shown; findings below may reference images not displayed]

FINDINGS: There are no findings suspicious for malignancy. Images were
processed with CAD.
IMPRESSION: No mammographic evidence of malignancy. A result letter of this
screening mammogram will be mailed directly to the patient.

RECOMMENDATION:
Screening mammogram in one year. (Code:[id number]-[id number])

BI-RADS CATEGORY  1: Negative.

## 2021-01-01 DIAGNOSIS — I10 Essential (primary) hypertension: Secondary | ICD-10-CM | POA: Diagnosis not present

## 2021-01-01 DIAGNOSIS — E782 Mixed hyperlipidemia: Secondary | ICD-10-CM | POA: Diagnosis not present

## 2021-01-01 DIAGNOSIS — Z79899 Other long term (current) drug therapy: Secondary | ICD-10-CM | POA: Diagnosis not present

## 2021-01-01 DIAGNOSIS — R8290 Unspecified abnormal findings in urine: Secondary | ICD-10-CM | POA: Diagnosis not present

## 2021-01-06 DIAGNOSIS — I10 Essential (primary) hypertension: Secondary | ICD-10-CM | POA: Diagnosis not present

## 2021-01-06 DIAGNOSIS — E782 Mixed hyperlipidemia: Secondary | ICD-10-CM | POA: Diagnosis not present

## 2021-01-06 DIAGNOSIS — Z79899 Other long term (current) drug therapy: Secondary | ICD-10-CM | POA: Diagnosis not present

## 2021-01-06 DIAGNOSIS — M5135 Other intervertebral disc degeneration, thoracolumbar region: Secondary | ICD-10-CM | POA: Diagnosis not present

## 2021-01-06 DIAGNOSIS — Z0000 Encounter for general adult medical examination without abnormal findings: Secondary | ICD-10-CM | POA: Diagnosis not present

## 2021-01-06 DIAGNOSIS — Z1211 Encounter for screening for malignant neoplasm of colon: Secondary | ICD-10-CM | POA: Diagnosis not present

## 2021-01-06 DIAGNOSIS — N390 Urinary tract infection, site not specified: Secondary | ICD-10-CM | POA: Diagnosis not present

## 2021-01-13 DIAGNOSIS — Z1211 Encounter for screening for malignant neoplasm of colon: Secondary | ICD-10-CM | POA: Diagnosis not present

## 2021-02-12 DIAGNOSIS — R300 Dysuria: Secondary | ICD-10-CM | POA: Diagnosis not present

## 2021-02-27 DIAGNOSIS — N390 Urinary tract infection, site not specified: Secondary | ICD-10-CM | POA: Diagnosis not present

## 2021-02-27 DIAGNOSIS — R350 Frequency of micturition: Secondary | ICD-10-CM | POA: Diagnosis not present

## 2021-04-02 DIAGNOSIS — R300 Dysuria: Secondary | ICD-10-CM | POA: Diagnosis not present

## 2021-04-02 DIAGNOSIS — Z87440 Personal history of urinary (tract) infections: Secondary | ICD-10-CM | POA: Diagnosis not present

## 2021-05-28 DIAGNOSIS — E782 Mixed hyperlipidemia: Secondary | ICD-10-CM | POA: Diagnosis not present

## 2021-05-28 DIAGNOSIS — I10 Essential (primary) hypertension: Secondary | ICD-10-CM | POA: Diagnosis not present

## 2021-05-28 DIAGNOSIS — Z79899 Other long term (current) drug therapy: Secondary | ICD-10-CM | POA: Diagnosis not present

## 2021-06-04 DIAGNOSIS — Z79899 Other long term (current) drug therapy: Secondary | ICD-10-CM | POA: Diagnosis not present

## 2021-06-04 DIAGNOSIS — M1990 Unspecified osteoarthritis, unspecified site: Secondary | ICD-10-CM | POA: Diagnosis not present

## 2021-06-04 DIAGNOSIS — E7800 Pure hypercholesterolemia, unspecified: Secondary | ICD-10-CM | POA: Diagnosis not present

## 2021-06-04 DIAGNOSIS — I10 Essential (primary) hypertension: Secondary | ICD-10-CM | POA: Diagnosis not present

## 2021-10-22 DIAGNOSIS — I10 Essential (primary) hypertension: Secondary | ICD-10-CM | POA: Diagnosis not present

## 2021-10-22 DIAGNOSIS — Z0000 Encounter for general adult medical examination without abnormal findings: Secondary | ICD-10-CM | POA: Diagnosis not present

## 2021-10-22 DIAGNOSIS — E782 Mixed hyperlipidemia: Secondary | ICD-10-CM | POA: Diagnosis not present

## 2021-12-08 DIAGNOSIS — R001 Bradycardia, unspecified: Secondary | ICD-10-CM | POA: Diagnosis not present

## 2021-12-08 DIAGNOSIS — E785 Hyperlipidemia, unspecified: Secondary | ICD-10-CM | POA: Diagnosis not present

## 2021-12-08 DIAGNOSIS — R4182 Altered mental status, unspecified: Secondary | ICD-10-CM | POA: Diagnosis not present

## 2021-12-08 DIAGNOSIS — I10 Essential (primary) hypertension: Secondary | ICD-10-CM | POA: Diagnosis not present

## 2021-12-08 DIAGNOSIS — R519 Headache, unspecified: Secondary | ICD-10-CM | POA: Diagnosis not present

## 2021-12-08 DIAGNOSIS — R42 Dizziness and giddiness: Secondary | ICD-10-CM | POA: Diagnosis not present

## 2021-12-08 DIAGNOSIS — N3000 Acute cystitis without hematuria: Secondary | ICD-10-CM | POA: Diagnosis not present

## 2021-12-08 DIAGNOSIS — K589 Irritable bowel syndrome without diarrhea: Secondary | ICD-10-CM | POA: Diagnosis not present

## 2021-12-08 DIAGNOSIS — Z880 Allergy status to penicillin: Secondary | ICD-10-CM | POA: Diagnosis not present

## 2021-12-08 DIAGNOSIS — Z882 Allergy status to sulfonamides status: Secondary | ICD-10-CM | POA: Diagnosis not present

## 2021-12-08 DIAGNOSIS — M1990 Unspecified osteoarthritis, unspecified site: Secondary | ICD-10-CM | POA: Diagnosis not present

## 2021-12-08 DIAGNOSIS — K219 Gastro-esophageal reflux disease without esophagitis: Secondary | ICD-10-CM | POA: Diagnosis not present

## 2021-12-08 DIAGNOSIS — G459 Transient cerebral ischemic attack, unspecified: Secondary | ICD-10-CM | POA: Diagnosis not present

## 2021-12-10 DIAGNOSIS — G459 Transient cerebral ischemic attack, unspecified: Secondary | ICD-10-CM | POA: Diagnosis not present

## 2021-12-10 DIAGNOSIS — N3000 Acute cystitis without hematuria: Secondary | ICD-10-CM | POA: Diagnosis not present

## 2021-12-15 ENCOUNTER — Other Ambulatory Visit: Payer: Self-pay | Admitting: Internal Medicine

## 2021-12-15 DIAGNOSIS — G459 Transient cerebral ischemic attack, unspecified: Secondary | ICD-10-CM

## 2021-12-30 ENCOUNTER — Ambulatory Visit
Admission: RE | Admit: 2021-12-30 | Discharge: 2021-12-30 | Disposition: A | Discharge status: Home / Self Care | Payer: PPO | Source: Ambulatory Visit | Attending: Internal Medicine | Admitting: Internal Medicine

## 2021-12-30 DIAGNOSIS — I6381 Other cerebral infarction due to occlusion or stenosis of small artery: Secondary | ICD-10-CM | POA: Diagnosis not present

## 2021-12-30 DIAGNOSIS — I6782 Cerebral ischemia: Secondary | ICD-10-CM | POA: Diagnosis not present

## 2021-12-30 DIAGNOSIS — G459 Transient cerebral ischemic attack, unspecified: Secondary | ICD-10-CM

## 2021-12-30 DIAGNOSIS — G319 Degenerative disease of nervous system, unspecified: Secondary | ICD-10-CM | POA: Diagnosis not present

## 2022-01-29 DIAGNOSIS — I10 Essential (primary) hypertension: Secondary | ICD-10-CM | POA: Diagnosis not present

## 2022-01-29 DIAGNOSIS — R8290 Unspecified abnormal findings in urine: Secondary | ICD-10-CM | POA: Diagnosis not present

## 2022-01-29 DIAGNOSIS — E7800 Pure hypercholesterolemia, unspecified: Secondary | ICD-10-CM | POA: Diagnosis not present

## 2022-01-29 DIAGNOSIS — Z79899 Other long term (current) drug therapy: Secondary | ICD-10-CM | POA: Diagnosis not present

## 2022-02-01 DIAGNOSIS — G459 Transient cerebral ischemic attack, unspecified: Secondary | ICD-10-CM | POA: Diagnosis not present

## 2022-02-01 DIAGNOSIS — I6523 Occlusion and stenosis of bilateral carotid arteries: Secondary | ICD-10-CM | POA: Diagnosis not present

## 2022-02-01 DIAGNOSIS — E041 Nontoxic single thyroid nodule: Secondary | ICD-10-CM | POA: Diagnosis not present

## 2022-02-04 DIAGNOSIS — Z0000 Encounter for general adult medical examination without abnormal findings: Secondary | ICD-10-CM | POA: Diagnosis not present

## 2022-02-04 DIAGNOSIS — I10 Essential (primary) hypertension: Secondary | ICD-10-CM | POA: Diagnosis not present

## 2022-02-04 DIAGNOSIS — E782 Mixed hyperlipidemia: Secondary | ICD-10-CM | POA: Diagnosis not present

## 2022-02-04 DIAGNOSIS — K219 Gastro-esophageal reflux disease without esophagitis: Secondary | ICD-10-CM | POA: Diagnosis not present

## 2022-02-04 DIAGNOSIS — M5135 Other intervertebral disc degeneration, thoracolumbar region: Secondary | ICD-10-CM | POA: Diagnosis not present

## 2022-06-03 DIAGNOSIS — I10 Essential (primary) hypertension: Secondary | ICD-10-CM | POA: Diagnosis not present

## 2022-06-03 DIAGNOSIS — Z8673 Personal history of transient ischemic attack (TIA), and cerebral infarction without residual deficits: Secondary | ICD-10-CM | POA: Diagnosis not present

## 2022-06-03 DIAGNOSIS — N183 Chronic kidney disease, stage 3 unspecified: Secondary | ICD-10-CM | POA: Diagnosis not present

## 2022-06-03 DIAGNOSIS — K219 Gastro-esophageal reflux disease without esophagitis: Secondary | ICD-10-CM | POA: Diagnosis not present

## 2022-06-03 DIAGNOSIS — Z79899 Other long term (current) drug therapy: Secondary | ICD-10-CM | POA: Diagnosis not present

## 2022-06-03 DIAGNOSIS — E782 Mixed hyperlipidemia: Secondary | ICD-10-CM | POA: Diagnosis not present

## 2022-06-03 DIAGNOSIS — R8290 Unspecified abnormal findings in urine: Secondary | ICD-10-CM | POA: Diagnosis not present

## 2022-06-03 DIAGNOSIS — Z87440 Personal history of urinary (tract) infections: Secondary | ICD-10-CM | POA: Diagnosis not present

## 2022-06-04 DIAGNOSIS — Z8673 Personal history of transient ischemic attack (TIA), and cerebral infarction without residual deficits: Secondary | ICD-10-CM | POA: Diagnosis not present

## 2022-06-04 DIAGNOSIS — Z79899 Other long term (current) drug therapy: Secondary | ICD-10-CM | POA: Diagnosis not present

## 2022-06-04 DIAGNOSIS — K219 Gastro-esophageal reflux disease without esophagitis: Secondary | ICD-10-CM | POA: Diagnosis not present

## 2022-06-04 DIAGNOSIS — N183 Chronic kidney disease, stage 3 unspecified: Secondary | ICD-10-CM | POA: Diagnosis not present

## 2022-06-04 DIAGNOSIS — I10 Essential (primary) hypertension: Secondary | ICD-10-CM | POA: Diagnosis not present

## 2022-06-04 DIAGNOSIS — Z87440 Personal history of urinary (tract) infections: Secondary | ICD-10-CM | POA: Diagnosis not present

## 2022-06-04 DIAGNOSIS — R8290 Unspecified abnormal findings in urine: Secondary | ICD-10-CM | POA: Diagnosis not present

## 2022-06-04 DIAGNOSIS — E782 Mixed hyperlipidemia: Secondary | ICD-10-CM | POA: Diagnosis not present

## 2022-09-23 DIAGNOSIS — H18413 Arcus senilis, bilateral: Secondary | ICD-10-CM | POA: Diagnosis not present

## 2022-09-23 DIAGNOSIS — H2512 Age-related nuclear cataract, left eye: Secondary | ICD-10-CM | POA: Diagnosis not present

## 2022-09-23 DIAGNOSIS — H25043 Posterior subcapsular polar age-related cataract, bilateral: Secondary | ICD-10-CM | POA: Diagnosis not present

## 2022-09-23 DIAGNOSIS — H353131 Nonexudative age-related macular degeneration, bilateral, early dry stage: Secondary | ICD-10-CM | POA: Diagnosis not present

## 2022-09-23 DIAGNOSIS — H2513 Age-related nuclear cataract, bilateral: Secondary | ICD-10-CM | POA: Diagnosis not present

## 2022-10-07 DIAGNOSIS — N183 Chronic kidney disease, stage 3 unspecified: Secondary | ICD-10-CM | POA: Diagnosis not present

## 2022-10-07 DIAGNOSIS — I10 Essential (primary) hypertension: Secondary | ICD-10-CM | POA: Diagnosis not present

## 2022-10-07 DIAGNOSIS — E7800 Pure hypercholesterolemia, unspecified: Secondary | ICD-10-CM | POA: Diagnosis not present

## 2022-10-07 DIAGNOSIS — K219 Gastro-esophageal reflux disease without esophagitis: Secondary | ICD-10-CM | POA: Diagnosis not present

## 2022-10-07 DIAGNOSIS — R8290 Unspecified abnormal findings in urine: Secondary | ICD-10-CM | POA: Diagnosis not present

## 2022-10-07 DIAGNOSIS — Z79899 Other long term (current) drug therapy: Secondary | ICD-10-CM | POA: Diagnosis not present

## 2022-10-07 DIAGNOSIS — L989 Disorder of the skin and subcutaneous tissue, unspecified: Secondary | ICD-10-CM | POA: Diagnosis not present

## 2022-10-07 DIAGNOSIS — Z0000 Encounter for general adult medical examination without abnormal findings: Secondary | ICD-10-CM | POA: Diagnosis not present

## 2022-10-08 DIAGNOSIS — R8290 Unspecified abnormal findings in urine: Secondary | ICD-10-CM | POA: Diagnosis not present

## 2022-12-01 DIAGNOSIS — H2512 Age-related nuclear cataract, left eye: Secondary | ICD-10-CM | POA: Diagnosis not present

## 2022-12-02 DIAGNOSIS — H2511 Age-related nuclear cataract, right eye: Secondary | ICD-10-CM | POA: Diagnosis not present

## 2022-12-23 DIAGNOSIS — C441192 Basal cell carcinoma of skin of left lower eyelid, including canthus: Secondary | ICD-10-CM | POA: Diagnosis not present

## 2022-12-23 DIAGNOSIS — L720 Epidermal cyst: Secondary | ICD-10-CM | POA: Diagnosis not present

## 2022-12-23 DIAGNOSIS — L821 Other seborrheic keratosis: Secondary | ICD-10-CM | POA: Diagnosis not present

## 2022-12-23 DIAGNOSIS — D485 Neoplasm of uncertain behavior of skin: Secondary | ICD-10-CM | POA: Diagnosis not present

## 2023-01-05 DIAGNOSIS — H2511 Age-related nuclear cataract, right eye: Secondary | ICD-10-CM | POA: Diagnosis not present

## 2023-01-13 DIAGNOSIS — R399 Unspecified symptoms and signs involving the genitourinary system: Secondary | ICD-10-CM | POA: Diagnosis not present

## 2023-01-25 DIAGNOSIS — C441192 Basal cell carcinoma of skin of left lower eyelid, including canthus: Secondary | ICD-10-CM | POA: Diagnosis not present

## 2023-01-25 DIAGNOSIS — C4491 Basal cell carcinoma of skin, unspecified: Secondary | ICD-10-CM | POA: Diagnosis not present

## 2023-02-17 DIAGNOSIS — Z79899 Other long term (current) drug therapy: Secondary | ICD-10-CM | POA: Diagnosis not present

## 2023-02-17 DIAGNOSIS — I10 Essential (primary) hypertension: Secondary | ICD-10-CM | POA: Diagnosis not present

## 2023-02-17 DIAGNOSIS — K219 Gastro-esophageal reflux disease without esophagitis: Secondary | ICD-10-CM | POA: Diagnosis not present

## 2023-02-17 DIAGNOSIS — K589 Irritable bowel syndrome without diarrhea: Secondary | ICD-10-CM | POA: Diagnosis not present

## 2023-02-17 DIAGNOSIS — Z0000 Encounter for general adult medical examination without abnormal findings: Secondary | ICD-10-CM | POA: Diagnosis not present

## 2023-02-17 DIAGNOSIS — E782 Mixed hyperlipidemia: Secondary | ICD-10-CM | POA: Diagnosis not present

## 2023-02-17 DIAGNOSIS — R8290 Unspecified abnormal findings in urine: Secondary | ICD-10-CM | POA: Diagnosis not present

## 2023-02-17 DIAGNOSIS — N183 Chronic kidney disease, stage 3 unspecified: Secondary | ICD-10-CM | POA: Diagnosis not present

## 2023-02-17 DIAGNOSIS — M1990 Unspecified osteoarthritis, unspecified site: Secondary | ICD-10-CM | POA: Diagnosis not present

## 2023-02-18 DIAGNOSIS — Z0000 Encounter for general adult medical examination without abnormal findings: Secondary | ICD-10-CM | POA: Diagnosis not present

## 2023-02-18 DIAGNOSIS — I10 Essential (primary) hypertension: Secondary | ICD-10-CM | POA: Diagnosis not present

## 2023-02-18 DIAGNOSIS — E782 Mixed hyperlipidemia: Secondary | ICD-10-CM | POA: Diagnosis not present

## 2023-02-18 DIAGNOSIS — K589 Irritable bowel syndrome without diarrhea: Secondary | ICD-10-CM | POA: Diagnosis not present

## 2023-02-18 DIAGNOSIS — R8290 Unspecified abnormal findings in urine: Secondary | ICD-10-CM | POA: Diagnosis not present

## 2023-02-18 DIAGNOSIS — K219 Gastro-esophageal reflux disease without esophagitis: Secondary | ICD-10-CM | POA: Diagnosis not present

## 2023-02-18 DIAGNOSIS — N183 Chronic kidney disease, stage 3 unspecified: Secondary | ICD-10-CM | POA: Diagnosis not present

## 2023-02-18 DIAGNOSIS — M1990 Unspecified osteoarthritis, unspecified site: Secondary | ICD-10-CM | POA: Diagnosis not present

## 2023-02-18 DIAGNOSIS — Z79899 Other long term (current) drug therapy: Secondary | ICD-10-CM | POA: Diagnosis not present

## 2023-06-16 DIAGNOSIS — R8290 Unspecified abnormal findings in urine: Secondary | ICD-10-CM | POA: Diagnosis not present

## 2023-06-16 DIAGNOSIS — Z79899 Other long term (current) drug therapy: Secondary | ICD-10-CM | POA: Diagnosis not present

## 2023-06-16 DIAGNOSIS — E7800 Pure hypercholesterolemia, unspecified: Secondary | ICD-10-CM | POA: Diagnosis not present

## 2023-06-16 DIAGNOSIS — Z2821 Immunization not carried out because of patient refusal: Secondary | ICD-10-CM | POA: Diagnosis not present

## 2023-06-16 DIAGNOSIS — K219 Gastro-esophageal reflux disease without esophagitis: Secondary | ICD-10-CM | POA: Diagnosis not present

## 2023-06-16 DIAGNOSIS — I10 Essential (primary) hypertension: Secondary | ICD-10-CM | POA: Diagnosis not present

## 2023-06-16 DIAGNOSIS — N183 Chronic kidney disease, stage 3 unspecified: Secondary | ICD-10-CM | POA: Diagnosis not present

## 2023-06-17 DIAGNOSIS — Z2821 Immunization not carried out because of patient refusal: Secondary | ICD-10-CM | POA: Diagnosis not present

## 2023-06-17 DIAGNOSIS — R8290 Unspecified abnormal findings in urine: Secondary | ICD-10-CM | POA: Diagnosis not present

## 2023-06-17 DIAGNOSIS — Z79899 Other long term (current) drug therapy: Secondary | ICD-10-CM | POA: Diagnosis not present

## 2023-06-17 DIAGNOSIS — E7800 Pure hypercholesterolemia, unspecified: Secondary | ICD-10-CM | POA: Diagnosis not present

## 2023-06-17 DIAGNOSIS — N183 Chronic kidney disease, stage 3 unspecified: Secondary | ICD-10-CM | POA: Diagnosis not present

## 2023-06-17 DIAGNOSIS — I10 Essential (primary) hypertension: Secondary | ICD-10-CM | POA: Diagnosis not present

## 2023-06-17 DIAGNOSIS — K219 Gastro-esophageal reflux disease without esophagitis: Secondary | ICD-10-CM | POA: Diagnosis not present

## 2023-10-20 DIAGNOSIS — Z0000 Encounter for general adult medical examination without abnormal findings: Secondary | ICD-10-CM | POA: Diagnosis not present

## 2023-10-20 DIAGNOSIS — N183 Chronic kidney disease, stage 3 unspecified: Secondary | ICD-10-CM | POA: Diagnosis not present

## 2023-10-20 DIAGNOSIS — I10 Essential (primary) hypertension: Secondary | ICD-10-CM | POA: Diagnosis not present

## 2023-10-20 DIAGNOSIS — E782 Mixed hyperlipidemia: Secondary | ICD-10-CM | POA: Diagnosis not present

## 2023-10-20 DIAGNOSIS — R413 Other amnesia: Secondary | ICD-10-CM | POA: Diagnosis not present

## 2023-10-20 DIAGNOSIS — Z23 Encounter for immunization: Secondary | ICD-10-CM | POA: Diagnosis not present

## 2023-10-20 DIAGNOSIS — M1990 Unspecified osteoarthritis, unspecified site: Secondary | ICD-10-CM | POA: Diagnosis not present

## 2023-10-20 DIAGNOSIS — Z79899 Other long term (current) drug therapy: Secondary | ICD-10-CM | POA: Diagnosis not present

## 2023-10-20 DIAGNOSIS — K589 Irritable bowel syndrome without diarrhea: Secondary | ICD-10-CM | POA: Diagnosis not present

## 2023-10-21 DIAGNOSIS — N183 Chronic kidney disease, stage 3 unspecified: Secondary | ICD-10-CM | POA: Diagnosis not present

## 2023-10-21 DIAGNOSIS — E782 Mixed hyperlipidemia: Secondary | ICD-10-CM | POA: Diagnosis not present

## 2023-10-21 DIAGNOSIS — Z79899 Other long term (current) drug therapy: Secondary | ICD-10-CM | POA: Diagnosis not present

## 2023-10-21 DIAGNOSIS — K589 Irritable bowel syndrome without diarrhea: Secondary | ICD-10-CM | POA: Diagnosis not present

## 2023-10-21 DIAGNOSIS — Z23 Encounter for immunization: Secondary | ICD-10-CM | POA: Diagnosis not present

## 2023-10-21 DIAGNOSIS — I10 Essential (primary) hypertension: Secondary | ICD-10-CM | POA: Diagnosis not present

## 2023-10-21 DIAGNOSIS — M1990 Unspecified osteoarthritis, unspecified site: Secondary | ICD-10-CM | POA: Diagnosis not present

## 2023-10-21 DIAGNOSIS — R413 Other amnesia: Secondary | ICD-10-CM | POA: Diagnosis not present

## 2024-02-06 DIAGNOSIS — Z85828 Personal history of other malignant neoplasm of skin: Secondary | ICD-10-CM | POA: Diagnosis not present

## 2024-02-06 DIAGNOSIS — L578 Other skin changes due to chronic exposure to nonionizing radiation: Secondary | ICD-10-CM | POA: Diagnosis not present

## 2024-02-06 DIAGNOSIS — L814 Other melanin hyperpigmentation: Secondary | ICD-10-CM | POA: Diagnosis not present

## 2024-02-06 DIAGNOSIS — L570 Actinic keratosis: Secondary | ICD-10-CM | POA: Diagnosis not present

## 2024-02-06 DIAGNOSIS — D485 Neoplasm of uncertain behavior of skin: Secondary | ICD-10-CM | POA: Diagnosis not present

## 2024-04-03 DIAGNOSIS — R8290 Unspecified abnormal findings in urine: Secondary | ICD-10-CM | POA: Diagnosis not present

## 2024-04-03 DIAGNOSIS — N183 Chronic kidney disease, stage 3 unspecified: Secondary | ICD-10-CM | POA: Diagnosis not present

## 2024-04-03 DIAGNOSIS — E6609 Other obesity due to excess calories: Secondary | ICD-10-CM | POA: Diagnosis not present

## 2024-04-03 DIAGNOSIS — K219 Gastro-esophageal reflux disease without esophagitis: Secondary | ICD-10-CM | POA: Diagnosis not present

## 2024-04-03 DIAGNOSIS — Z1331 Encounter for screening for depression: Secondary | ICD-10-CM | POA: Diagnosis not present

## 2024-04-03 DIAGNOSIS — E782 Mixed hyperlipidemia: Secondary | ICD-10-CM | POA: Diagnosis not present

## 2024-04-03 DIAGNOSIS — I10 Essential (primary) hypertension: Secondary | ICD-10-CM | POA: Diagnosis not present

## 2024-04-03 DIAGNOSIS — Z6831 Body mass index (BMI) 31.0-31.9, adult: Secondary | ICD-10-CM | POA: Diagnosis not present

## 2024-04-03 DIAGNOSIS — Z0000 Encounter for general adult medical examination without abnormal findings: Secondary | ICD-10-CM | POA: Diagnosis not present

## 2024-04-03 DIAGNOSIS — Z23 Encounter for immunization: Secondary | ICD-10-CM | POA: Diagnosis not present

## 2024-04-03 DIAGNOSIS — E66811 Obesity, class 1: Secondary | ICD-10-CM | POA: Diagnosis not present

## 2024-04-03 DIAGNOSIS — Z79899 Other long term (current) drug therapy: Secondary | ICD-10-CM | POA: Diagnosis not present

## 2024-07-30 ENCOUNTER — Other Ambulatory Visit: Payer: Self-pay | Admitting: Orthopedic Surgery

## 2024-07-30 DIAGNOSIS — M4807 Spinal stenosis, lumbosacral region: Secondary | ICD-10-CM

## 2024-07-31 ENCOUNTER — Ambulatory Visit
Admission: RE | Admit: 2024-07-31 | Discharge: 2024-07-31 | Disposition: A | Discharge status: Home / Self Care | Source: Ambulatory Visit | Attending: Orthopedic Surgery | Admitting: Orthopedic Surgery

## 2024-07-31 DIAGNOSIS — M4807 Spinal stenosis, lumbosacral region: Secondary | ICD-10-CM | POA: Insufficient documentation
